# Patient Record
Sex: Female | Born: 1980 | Race: White | Hispanic: No | Marital: Married | Smoking: Never smoker
Health system: Southern US, Community
[De-identification: ages and names within clinical notes are randomized; demographics above are authoritative.]

## PROBLEM LIST (undated history)

## (undated) DIAGNOSIS — A6000 Herpesviral infection of urogenital system, unspecified: Secondary | ICD-10-CM

## (undated) DIAGNOSIS — Z9889 Other specified postprocedural states: Secondary | ICD-10-CM

## (undated) DIAGNOSIS — R112 Nausea with vomiting, unspecified: Secondary | ICD-10-CM

## (undated) DIAGNOSIS — G44229 Chronic tension-type headache, not intractable: Secondary | ICD-10-CM

## (undated) HISTORY — DX: Herpesviral infection of urogenital system, unspecified: A60.00

## (undated) HISTORY — PX: BREAST ENHANCEMENT SURGERY: SHX7

## (undated) HISTORY — PX: ABDOMINOPLASTY: SUR9

## (undated) HISTORY — DX: Chronic tension-type headache, not intractable: G44.229

---
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
## 2001-05-12 ENCOUNTER — Inpatient Hospital Stay (HOSPITAL_COMMUNITY): Admission: AD | Admit: 2001-05-12 | Discharge: 2001-05-12 | Payer: Self-pay | Admitting: *Deleted

## 2001-05-26 ENCOUNTER — Other Ambulatory Visit: Admission: RE | Admit: 2001-05-26 | Discharge: 2001-05-26 | Payer: Self-pay | Admitting: *Deleted

## 2001-06-10 ENCOUNTER — Inpatient Hospital Stay (HOSPITAL_COMMUNITY): Admission: AD | Admit: 2001-06-10 | Discharge: 2001-06-10 | Payer: Self-pay | Admitting: *Deleted

## 2001-06-16 ENCOUNTER — Inpatient Hospital Stay (HOSPITAL_COMMUNITY): Admission: AD | Admit: 2001-06-16 | Discharge: 2001-06-16 | Payer: Self-pay | Admitting: *Deleted

## 2001-11-05 ENCOUNTER — Inpatient Hospital Stay (HOSPITAL_COMMUNITY): Admission: AD | Admit: 2001-11-05 | Discharge: 2001-11-05 | Payer: Self-pay | Admitting: *Deleted

## 2001-11-27 ENCOUNTER — Inpatient Hospital Stay (HOSPITAL_COMMUNITY): Admission: AD | Admit: 2001-11-27 | Discharge: 2001-11-27 | Payer: Self-pay | Admitting: *Deleted

## 2001-11-30 ENCOUNTER — Inpatient Hospital Stay (HOSPITAL_COMMUNITY): Admission: AD | Admit: 2001-11-30 | Discharge: 2001-12-02 | Payer: Self-pay | Admitting: *Deleted

## 2002-06-03 ENCOUNTER — Encounter: Admission: RE | Admit: 2002-06-03 | Discharge: 2002-06-03 | Payer: Self-pay | Admitting: Family Medicine

## 2002-07-06 ENCOUNTER — Encounter: Admission: RE | Admit: 2002-07-06 | Discharge: 2002-07-06 | Payer: Self-pay | Admitting: Pediatrics

## 2002-09-16 ENCOUNTER — Other Ambulatory Visit: Admission: RE | Admit: 2002-09-16 | Discharge: 2002-09-16 | Payer: Self-pay | Admitting: Family Medicine

## 2002-09-16 ENCOUNTER — Encounter: Admission: RE | Admit: 2002-09-16 | Discharge: 2002-09-16 | Payer: Self-pay | Admitting: Family Medicine

## 2002-09-17 ENCOUNTER — Encounter: Admission: RE | Admit: 2002-09-17 | Discharge: 2002-09-17 | Payer: Self-pay | Admitting: Sports Medicine

## 2002-09-17 ENCOUNTER — Encounter: Payer: Self-pay | Admitting: Sports Medicine

## 2002-10-16 ENCOUNTER — Encounter: Admission: RE | Admit: 2002-10-16 | Discharge: 2002-10-16 | Payer: Self-pay | Admitting: Family Medicine

## 2002-12-03 ENCOUNTER — Emergency Department (HOSPITAL_COMMUNITY): Admission: EM | Admit: 2002-12-03 | Discharge: 2002-12-03 | Payer: Self-pay | Admitting: Emergency Medicine

## 2003-06-18 ENCOUNTER — Encounter: Admission: RE | Admit: 2003-06-18 | Discharge: 2003-06-18 | Payer: Self-pay | Admitting: Sports Medicine

## 2003-06-23 ENCOUNTER — Encounter: Admission: RE | Admit: 2003-06-23 | Discharge: 2003-06-23 | Payer: Self-pay | Admitting: Sports Medicine

## 2003-11-18 ENCOUNTER — Emergency Department (HOSPITAL_COMMUNITY): Admission: EM | Admit: 2003-11-18 | Discharge: 2003-11-18 | Payer: Self-pay | Admitting: Emergency Medicine

## 2004-07-13 ENCOUNTER — Emergency Department (HOSPITAL_COMMUNITY): Admission: EM | Admit: 2004-07-13 | Discharge: 2004-07-13 | Payer: Self-pay | Admitting: Emergency Medicine

## 2004-07-17 ENCOUNTER — Encounter (INDEPENDENT_AMBULATORY_CARE_PROVIDER_SITE_OTHER): Payer: Self-pay | Admitting: *Deleted

## 2004-07-17 ENCOUNTER — Ambulatory Visit: Payer: Self-pay | Admitting: Family Medicine

## 2004-07-17 ENCOUNTER — Other Ambulatory Visit: Admission: RE | Admit: 2004-07-17 | Discharge: 2004-07-17 | Payer: Self-pay | Admitting: Family Medicine

## 2004-08-15 ENCOUNTER — Ambulatory Visit: Payer: Self-pay | Admitting: Family Medicine

## 2004-09-08 ENCOUNTER — Ambulatory Visit: Payer: Self-pay | Admitting: Family Medicine

## 2004-10-12 ENCOUNTER — Other Ambulatory Visit: Admission: RE | Admit: 2004-10-12 | Discharge: 2004-10-12 | Payer: Self-pay | Admitting: Family Medicine

## 2004-10-12 ENCOUNTER — Encounter (INDEPENDENT_AMBULATORY_CARE_PROVIDER_SITE_OTHER): Payer: Self-pay | Admitting: Specialist

## 2004-10-12 ENCOUNTER — Ambulatory Visit: Payer: Self-pay | Admitting: Family Medicine

## 2004-10-26 ENCOUNTER — Ambulatory Visit: Payer: Self-pay | Admitting: Family Medicine

## 2005-04-23 ENCOUNTER — Ambulatory Visit: Payer: Self-pay | Admitting: Family Medicine

## 2005-04-26 ENCOUNTER — Ambulatory Visit: Payer: Self-pay | Admitting: Obstetrics and Gynecology

## 2005-04-26 ENCOUNTER — Encounter: Payer: Self-pay | Admitting: Obstetrics and Gynecology

## 2005-08-15 ENCOUNTER — Encounter (INDEPENDENT_AMBULATORY_CARE_PROVIDER_SITE_OTHER): Payer: Self-pay | Admitting: *Deleted

## 2005-08-15 LAB — CONVERTED CEMR LAB

## 2005-10-08 ENCOUNTER — Ambulatory Visit: Payer: Self-pay | Admitting: Sports Medicine

## 2005-10-17 ENCOUNTER — Encounter: Payer: Self-pay | Admitting: Obstetrics and Gynecology

## 2005-10-17 ENCOUNTER — Ambulatory Visit: Payer: Self-pay | Admitting: Obstetrics & Gynecology

## 2006-01-24 ENCOUNTER — Ambulatory Visit: Payer: Self-pay | Admitting: Sports Medicine

## 2006-01-29 ENCOUNTER — Encounter: Admission: RE | Admit: 2006-01-29 | Discharge: 2006-01-29 | Payer: Self-pay | Admitting: Sports Medicine

## 2006-03-14 DIAGNOSIS — F329 Major depressive disorder, single episode, unspecified: Secondary | ICD-10-CM | POA: Insufficient documentation

## 2006-03-14 DIAGNOSIS — N879 Dysplasia of cervix uteri, unspecified: Secondary | ICD-10-CM | POA: Insufficient documentation

## 2006-03-14 DIAGNOSIS — F3289 Other specified depressive episodes: Secondary | ICD-10-CM

## 2006-03-14 HISTORY — DX: Major depressive disorder, single episode, unspecified: F32.9

## 2006-03-14 HISTORY — DX: Other specified depressive episodes: F32.89

## 2006-03-14 HISTORY — DX: Dysplasia of cervix uteri, unspecified: N87.9

## 2006-03-15 ENCOUNTER — Encounter (INDEPENDENT_AMBULATORY_CARE_PROVIDER_SITE_OTHER): Payer: Self-pay | Admitting: *Deleted

## 2006-06-13 ENCOUNTER — Telehealth: Payer: Self-pay | Admitting: *Deleted

## 2006-06-14 ENCOUNTER — Ambulatory Visit: Payer: Self-pay | Admitting: Family Medicine

## 2006-06-14 ENCOUNTER — Telehealth (INDEPENDENT_AMBULATORY_CARE_PROVIDER_SITE_OTHER): Payer: Self-pay | Admitting: *Deleted

## 2006-12-10 ENCOUNTER — Telehealth: Payer: Self-pay | Admitting: *Deleted

## 2006-12-31 ENCOUNTER — Encounter: Payer: Self-pay | Admitting: Family Medicine

## 2007-02-05 ENCOUNTER — Ambulatory Visit: Payer: Self-pay | Admitting: Obstetrics and Gynecology

## 2007-07-01 ENCOUNTER — Telehealth: Payer: Self-pay | Admitting: Family Medicine

## 2007-08-06 ENCOUNTER — Encounter: Payer: Self-pay | Admitting: Physician Assistant

## 2007-08-06 ENCOUNTER — Ambulatory Visit: Payer: Self-pay | Admitting: Obstetrics and Gynecology

## 2008-06-07 ENCOUNTER — Ambulatory Visit: Payer: Self-pay | Admitting: Family Medicine

## 2008-06-07 ENCOUNTER — Other Ambulatory Visit: Admission: RE | Admit: 2008-06-07 | Discharge: 2008-06-07 | Payer: Self-pay | Admitting: Family Medicine

## 2008-06-07 ENCOUNTER — Encounter: Payer: Self-pay | Admitting: Family Medicine

## 2008-06-07 LAB — CONVERTED CEMR LAB
Bilirubin Urine: NEGATIVE
Blood in Urine, dipstick: NEGATIVE
Glucose, Urine, Semiquant: NEGATIVE
Ketones, urine, test strip: NEGATIVE
Nitrite: NEGATIVE
Protein, U semiquant: NEGATIVE
Specific Gravity, Urine: 1.025
Urobilinogen, UA: 0.2
Whiff Test: POSITIVE
pH: 6

## 2008-06-10 LAB — CONVERTED CEMR LAB
Chlamydia, DNA Probe: NEGATIVE
GC Probe Amp, Genital: NEGATIVE

## 2008-07-14 ENCOUNTER — Ambulatory Visit: Payer: Self-pay | Admitting: Family Medicine

## 2008-07-15 ENCOUNTER — Telehealth: Payer: Self-pay | Admitting: Family Medicine

## 2008-07-16 ENCOUNTER — Telehealth: Payer: Self-pay | Admitting: Family Medicine

## 2008-07-21 ENCOUNTER — Encounter: Payer: Self-pay | Admitting: Family Medicine

## 2008-11-11 ENCOUNTER — Encounter: Payer: Self-pay | Admitting: Family Medicine

## 2008-11-11 ENCOUNTER — Ambulatory Visit: Payer: Self-pay | Admitting: Family Medicine

## 2008-11-11 LAB — CONVERTED CEMR LAB: Rapid Strep: NEGATIVE

## 2008-11-16 ENCOUNTER — Telehealth: Payer: Self-pay | Admitting: Family Medicine

## 2008-11-16 ENCOUNTER — Ambulatory Visit: Payer: Self-pay | Admitting: Family Medicine

## 2008-11-16 LAB — CONVERTED CEMR LAB: Heterophile Ab Screen: NEGATIVE

## 2008-11-23 ENCOUNTER — Telehealth: Payer: Self-pay | Admitting: Family Medicine

## 2009-01-25 ENCOUNTER — Telehealth: Payer: Self-pay | Admitting: Family Medicine

## 2009-01-25 ENCOUNTER — Ambulatory Visit: Payer: Self-pay | Admitting: Family Medicine

## 2009-01-25 ENCOUNTER — Encounter: Payer: Self-pay | Admitting: Family Medicine

## 2009-01-25 LAB — CONVERTED CEMR LAB: Beta hcg, urine, semiquantitative: POSITIVE

## 2009-01-26 LAB — CONVERTED CEMR LAB
Antibody Screen: NEGATIVE
Basophils Absolute: 0.1 10*3/uL (ref 0.0–0.1)
Basophils Relative: 1 % (ref 0–1)
Eosinophils Absolute: 0.3 10*3/uL (ref 0.0–0.7)
Eosinophils Relative: 3 % (ref 0–5)
HCT: 36.8 % (ref 36.0–46.0)
Hemoglobin: 12.9 g/dL (ref 12.0–15.0)
Hepatitis B Surface Ag: NEGATIVE
Lymphocytes Relative: 25 % (ref 12–46)
Lymphs Abs: 2.3 10*3/uL (ref 0.7–4.0)
MCHC: 35.1 g/dL (ref 30.0–36.0)
MCV: 82.5 fL (ref 78.0–100.0)
Monocytes Absolute: 0.4 10*3/uL (ref 0.1–1.0)
Monocytes Relative: 5 % (ref 3–12)
Neutro Abs: 6 10*3/uL (ref 1.7–7.7)
Neutrophils Relative %: 66 % (ref 43–77)
Platelets: 284 10*3/uL (ref 150–400)
RBC: 4.46 M/uL (ref 3.87–5.11)
RDW: 13.8 % (ref 11.5–15.5)
Rh Type: POSITIVE
Rubella: 234.7 intl units/mL — ABNORMAL HIGH
Sickle Cell Screen: NEGATIVE
WBC: 9.1 10*3/uL (ref 4.0–10.5)

## 2009-01-27 ENCOUNTER — Telehealth: Payer: Self-pay | Admitting: Family Medicine

## 2009-02-10 ENCOUNTER — Ambulatory Visit: Payer: Self-pay | Admitting: Family Medicine

## 2009-02-10 LAB — CONVERTED CEMR LAB

## 2009-02-11 ENCOUNTER — Telehealth: Payer: Self-pay | Admitting: *Deleted

## 2009-02-17 ENCOUNTER — Ambulatory Visit (HOSPITAL_COMMUNITY): Admission: RE | Admit: 2009-02-17 | Discharge: 2009-02-17 | Payer: Self-pay | Admitting: Family Medicine

## 2009-02-17 ENCOUNTER — Encounter: Payer: Self-pay | Admitting: Family Medicine

## 2009-02-24 ENCOUNTER — Telehealth: Payer: Self-pay | Admitting: Family Medicine

## 2009-03-09 ENCOUNTER — Encounter: Payer: Self-pay | Admitting: Family Medicine

## 2009-03-17 ENCOUNTER — Ambulatory Visit (HOSPITAL_COMMUNITY): Admission: RE | Admit: 2009-03-17 | Discharge: 2009-03-17 | Payer: Self-pay | Admitting: Sports Medicine

## 2009-04-07 ENCOUNTER — Ambulatory Visit (HOSPITAL_COMMUNITY): Admission: RE | Admit: 2009-04-07 | Discharge: 2009-04-07 | Payer: Self-pay | Admitting: Obstetrics and Gynecology

## 2009-09-20 ENCOUNTER — Encounter (INDEPENDENT_AMBULATORY_CARE_PROVIDER_SITE_OTHER): Payer: Self-pay | Admitting: Obstetrics and Gynecology

## 2009-09-20 ENCOUNTER — Inpatient Hospital Stay (HOSPITAL_COMMUNITY): Admission: RE | Admit: 2009-09-20 | Discharge: 2009-09-22 | Payer: Self-pay | Admitting: Obstetrics and Gynecology

## 2010-02-16 NOTE — Assessment & Plan Note (Signed)
Summary: 9.5 weeks   Vital Signs:  Patient profile:   30 year old female LMP:     12/04/2008 Height:      62 inches Weight:      116 pounds BP sitting:   122 / 76  Vitals Entered By: Jone Baseman CMA (February 10, 2009 3:33 PM) CC: NOB LMP (date): 12/04/2008 EDC by LMP==> 09/10/2009 EDC 09/10/2009     Enter LMP: 12/04/2008 Last PAP Result NEGATIVE FOR INTRAEPITHELIAL LESIONS OR MALIGNANCY.   Primary Care Provider:  Marisue Ivan  MD  CC:  NOB.  History of Present Illness: Pt is here for new OB visit, referred from her fantastic PCP of Dr. Burnadette Pop.  Pt is doing wewll, a little more gas then usual but otherwise feeling fine.  Pt states she is taking a MVI, her and her baby's father have been reading books and are very excited about the pregnacy.  Pt prevously had a vaginal term delivery > 7 yrs ago without complications.  Pt has had a LEEP procedure done after that pregnancy.  Last Pap smear in may was normal.     Pt is a G3P1011 @ 9.5 weeks. O+/ab neg, RI, Hep B neg, RPR NR,  HIV NR, SS neg, Hgb 12.9 Plt 284    Pt has breast implants but is wanting to attempt breast feeding.    Habits & Providers  Alcohol-Tobacco-Diet     Cigarette Packs/Day: n/a  Allergies: No Known Drug Allergies  Past History:  Past medical, surgical, family and social histories (including risk factors) reviewed, and no changes noted (except as noted below).  Past Medical History: Reviewed history from 01/25/2009 and no changes required. 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 None  Family History: Reviewed history from 01/25/2009 and no changes required. F-29yo, healthy,  M-29yo, asthma; depression GF died melanoma,   Social History: Reviewed history from 01/25/2009 and no changes required. Married. 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 Lungs are clear to auscultation, no crackles or wheezes. Heart:  Normal rate and regular rhythm. S1 and S2 normal without gallop, murmur, click, rub or other extra sounds. Abdomen:  Bowel sounds positive,abdomen soft and non-tender without masses, organomegaly or hernias noted.  Uterus palpable in pelvis Genitalia:  Pt has shaved genital, peircing    Impression & Recommendations:  Problem # 1:  PREGNANCY (ICD-V22.2) Assessment Unchanged Pt told what to expect.  Will have ultara sound and integrated screen  NEXT VISIT NEED GC/CHLAMYDIA PAP, and maybe AFP/ Quad Screen check on that.   O+, ab neg, RPR, HIV, Hep B NR, RI, Hgb 12.9 Plt 284  Orders: Prenatal U/S < 14 weeks - 29562  (Prenatal U/S) Obstetric Referral (Obstetric) Other OB visit- [facility] ([facility])  Complete Medication List: 1)  Gnp Prenatal Vitamins 28-0.8 Mg Tabs (Prenatal vit-fe fumarate-fa) .... One tablet by mouth daily  Prenatal Visit EDC Confirmation:    New working [facility] 09/10/2009    Last menses onset (LMP) date: 12/04/2008    EDC by LMP:  09/10/2009   Flowsheet View for Follow-up Visit    Estimated weeks of       gestation:     9 5/7    Weight:     116    Blood pressure:   122 / 76    Headache:     No    Nausea/vomiting:   No    Edema:     0    Vaginal bleeding:   no    Vaginal discharge:   no    Fundal height:      9    Labor symptoms:   no    Cx Dilation:     0    Cx Effacement:   0%    Cx Station:     high    Taking prenatal vits?   Y    Smoking:     n/a    Next visit:     4 wk    Resident:      Katrinka Blazing    Preceptor:     Chambliss   Past Pregnancy History    Gravida:     3    Term Births:     1    Premature Births:   0    Living Children:   0    Para:       1    Mult. Births:     0    Prev C-Section:   0    Prev. VBAC attempt?   none    Aborta:     1    Elect. Ab:     0    Spont. 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    Number of children at home: 1  Menstrual History    LMP (date): 12/04/2008    EDC by LMP: 09/10/2009    Best Working EDC: 09/10/2009   Genetic History     Thalassemia:     mother: no    Neural tube defect:   mother: no    Down's Syndrome:   mother: no    Tay-Sachs:     mother: no    Sickle Cell Dz/Trait:   mother: no    Hemophilia:     mother: no    Muscular Dystrophy:   mother: no    Cystic Fibrosis:   mother: no    Huntington's Dz:   mother: no    Mental Retardation:   mother: no    Fragile X:     mother: no    Other Genetic or       Chromosomal Dz:   mother: no    Child with other       birth defect:     mother: no    > 3 spont. abortions:   mother: no    Hx of stillbirth:     mother: no  Infection Risk History    High Risk Hepatitis B: no    Immunized against Hepatitis B: no    Exposure to TB: no    Patient with history of Genital Herpes: no    Sexual partner with history of Genital Herpes: no    History of STD (GC, Chlamydia, Syphilis, HPV): no    Rash, Viral, or Febrile Illness since LMP: no    Exposure  to Cat Litter: no  Environmental Exposures    Xray Exposure since LMP: no    Chemical or other exposure: no    Medication, drug, or alcohol use since LMP: no     Prevention & Chronic Care Immunizations   Influenza vaccine: Not documented    Tetanus booster: 05/16/2002: Done.   Tetanus booster due: 05/15/2012    Pneumococcal vaccine: Not documented  Other Screening   Pap smear: NEGATIVE FOR  INTRAEPITHELIAL LESIONS OR MALIGNANCY.  (06/07/2008)   Pap smear due: 06/07/2009   Smoking status: never  (01/25/2009)  Appended Document: 9.5 weeks Pt with h/o LEEP after her term delivery, so will refer to high risk for consult.  Also due for flu shot.  I

## 2010-02-16 NOTE — Assessment & Plan Note (Signed)
Summary: new pregnancy   Vital Signs:  Patient profile:   30 year old female Height:      64 inches Weight:      116.7 pounds BMI:     20.10 Temp:     97.5 degrees F oral Pulse rate:   85 / minute BP sitting:   116 / 78  (left arm) Cuff size:   regular  Vitals Entered By: Gladstone Pih (January 25, 2009 2:19 PM) CC: missed last period Is Patient Diabetic? No Pain Assessment Patient in pain? no        Primary Care Provider:  Marisue Ivan  MD  CC:  missed last period.  History of Present Illness: 30yo F here b/c she missed her last period  Missed last period: LMP 12/03/2008.  Was previously on Sprintec but has admitted to missing occasional doses and has not had it since the middle of December.  Denies any abd pain, fever, or vaginal bleeding/discharge.  Habits & Providers  Alcohol-Tobacco-Diet     Tobacco Status: never  Current Medications (verified): 1)  Gnp Prenatal Vitamins 28-0.8 Mg Tabs (Prenatal Vit-Fe Fumarate-Fa) .... 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 lives with  son (b.2003 and husband no smoking/ETOH/ellicit drugs;   unemployed;   Review of Systems       Denies any abd pain, fever, or vaginal bleeding/discharge.  Physical Exam  General:  VS reviewed.  Well appearing, NAD. Psych:  Appropriate affect Seems geniunely happy about pregnancy   Impression & Recommendations:  Problem # 1:  PREGNANCY (ICD-V22.2) Assessment New Confirmed with [employer].  [id number] at 7 3/7 wks per LMP (12/03/2008) EDD: 09/09/2009 Obtain prenatal labs today.  Start PNVs.  Discussed integrated screen...will need to be set up via [facility] b/w 11-13 wks.   Last  pap smear: normal 06/07/2008 Will f/u with either myself or Dr. Katrinka Blazing in the next 2 weeks for initial OB visit.  Will need GC/Chl and flu vaccine at that time.  Orders: RPR-[facility] [phone]) HIV-[facility] [phone]) Prenatal-[facility] ([id number]) Urine Culture-[facility] ([id number]) Sickle Cell Scr-[facility] ([id number]) [facility]- Est  Level 4 (09323)  Complete Medication List: 1)  Gnp Prenatal Vitamins 28-0.8 Mg Tabs (Prenatal vit-fe fumarate-fa) .... One tablet by mouth daily  Other Orders: U Preg-[facility] (55732)  Patient Instructions: 1)  I will either see you in within the next 2 weeks for your initial OB visit or I'll have you see Dr. Terrilee Files. 2)  You are 7 wks and 3 days.  Estimated due date 09/09/2009. 3)  Start the prenatal vitamins. Prescriptions: GNP PRENATAL VITAMINS 28-0.8 MG TABS (PRENATAL VIT-FE FUMARATE-FA) one tablet by mouth daily  #30 x 11   Entered and Authorized by:   Marisue Ivan  MD   Signed by:   Marisue Ivan  MD on 01/25/2009   Method used:   Electronically to        [facility] (retail)       [address]       [place], [place]  [address]       Ph: [phone]       Fax: [phone]   RxID:   [id number]  Laboratory Results   Urine Tests  Date/Time Received: January 25, 2009 2:33 PM  Date/Time Reported: January 25, 2009 2:35 PM     Urine HCG: positive Comments: ...............test performed by......Marland KitchenBonnie A. [place], MLS (ASCP)cm       Prevention & Chronic Care Immunizations   Influenza vaccine: Not documented    Tetanus booster: 05/16/2002: Done.   Tetanus booster due: 05/15/2012    Pneumococcal vaccine: Not documented  Other Screening   Pap smear: NEGATIVE FOR INTRAEPITHELIAL LESIONS OR MALIGNANCY.  (06/07/2008)   Pap smear due: 06/07/2009   Smoking status: never  (01/25/2009)

## 2010-02-16 NOTE — Progress Notes (Signed)
Summary: phn msg   Phone Note Call from Patient Call back at [facility] Phone [phone]   Caller: Patient Summary of Call: Pt checking on status of ultrasound that she was to have done.  Wondering if it can be on Thursday. Initial call taken by: Clydell Hakim,  February 11, 2009 2:28 PM

## 2010-02-16 NOTE — Progress Notes (Signed)
Summary: triage   Phone Note Call from Patient Call back at Home Phone [phone]   Caller: Patient Summary of Call: Stopped taking birth control a couple of weeks ago and has not started period is this normal?  Follow-up for Phone Call        forgot to get refill. out of town. usual periods are on 20th. no menses since November. sexually active. will see Dr. Lafonda Mosses tomorrow at 4:15 to get pregnancy test. does not want to come in today Follow-up by: Golden Circle RN,  January 25, 2009 10:37 AM  Additional Follow-up for Phone Call Additional follow up Details #1::        she called back & can come for 2:15 today with pcp Additional Follow-up by: Golden Circle RN,  January 25, 2009 10:53 AM    Additional Follow-up for Phone Call Additional follow up Details #2::    Pt seen and is indeed preganant.   Follow-up by: Marisue Ivan  MD,  January 25, 2009 3:10 PM

## 2010-02-16 NOTE — Progress Notes (Signed)
Summary: triage   Phone Note Call from Patient Call back at Home Phone [phone]   Caller: Patient Summary of Call: needs to talk to nurse about what she can take for headache - 7 weeks preg. Initial call taken by: De Nurse,  January 27, 2009 11:14 AM  Follow-up for Phone Call        HA started today, told to take tylenol for the Ha, and call if any other symptom appear. Follow-up by: Gladstone Pih,  January 27, 2009 11:19 AM  Additional Follow-up for Phone Call Additional follow up Details #1::        agree with plan Additional Follow-up by: Marisue Ivan  MD,  January 27, 2009 11:25 AM

## 2010-02-16 NOTE — Miscellaneous (Signed)
Summary: cancelled appt   pt called to cancel OB appt for tomorrow.  Was asked if she wanted to resch she stated that she will have to call back after she figures out her sched.   De Nurse  March 09, 2009 9:14 AM  rec'd release of info for pt to go to [facility]  March 16, 2009 10:42 AM   Clinical Lists Changes  Appended Document: cancelled appt Pt is going to [facility]

## 2010-02-16 NOTE — Progress Notes (Signed)
Summary: Ultrasound Res   Phone Note Call from Patient Call back at Home Phone [phone]   Caller: Patient Summary of Call: Checking on results of ultrasound.   Initial call taken by: Clydell Hakim,  February 24, 2009 3:31 PM  Follow-up for Phone Call        wants to know if she should go this week to have test done for down syndrom - thinks she is too early for this. (they sched her for this last week at other [place] appt) Follow-up by: Theresia Lo RN,  February 24, 2009 4:33 PM  Additional Follow-up for Phone Call Additional follow up Details #1::        told her to keep this week's appt. Quad screen is usually done at 16 wk per B. [place]. assured her we will make all appts at appropriate times.  WANTS DR Katrinka Blazing TO CALL HER Additional Follow-up by: Golden Circle RN,  February 28, 2009 10:17 AM    Additional Follow-up for Phone Call Additional follow up Details #2::    Called pt back told her to keep appointment will get u/s as scheduled and then we will draw the blood at later date.

## 2010-02-21 ENCOUNTER — Other Ambulatory Visit: Payer: Self-pay | Admitting: Obstetrics and Gynecology

## 2010-02-21 ENCOUNTER — Other Ambulatory Visit (HOSPITAL_COMMUNITY)
Admission: RE | Admit: 2010-02-21 | Discharge: 2010-02-21 | Disposition: A | Discharge status: Home / Self Care | Payer: BC Managed Care – PPO | Source: Ambulatory Visit | Attending: Obstetrics and Gynecology | Admitting: Obstetrics and Gynecology

## 2010-02-21 DIAGNOSIS — Z01419 Encounter for gynecological examination (general) (routine) without abnormal findings: Secondary | ICD-10-CM | POA: Insufficient documentation

## 2010-02-24 ENCOUNTER — Encounter: Payer: Self-pay | Admitting: *Deleted

## 2010-03-30 LAB — CBC
HCT: 35 % — ABNORMAL LOW (ref 36.0–46.0)
HCT: 39.8 % (ref 36.0–46.0)
Hemoglobin: 12 g/dL (ref 12.0–15.0)
Hemoglobin: 13.5 g/dL (ref 12.0–15.0)
MCH: 29.5 pg (ref 26.0–34.0)
MCH: 30 pg (ref 26.0–34.0)
MCHC: 33.8 g/dL (ref 30.0–36.0)
MCHC: 34.2 g/dL (ref 30.0–36.0)
MCV: 87.2 fL (ref 78.0–100.0)
MCV: 87.6 fL (ref 78.0–100.0)
Platelets: 133 10*3/uL — ABNORMAL LOW (ref 150–400)
Platelets: 155 10*3/uL (ref 150–400)
RBC: 3.99 MIL/uL (ref 3.87–5.11)
RBC: 4.57 MIL/uL (ref 3.87–5.11)
RDW: 16 % — ABNORMAL HIGH (ref 11.5–15.5)
RDW: 16.1 % — ABNORMAL HIGH (ref 11.5–15.5)
WBC: 13.6 10*3/uL — ABNORMAL HIGH (ref 4.0–10.5)
WBC: 14.1 10*3/uL — ABNORMAL HIGH (ref 4.0–10.5)

## 2010-03-30 LAB — RPR: RPR Ser Ql: NONREACTIVE

## 2010-05-30 NOTE — Group Therapy Note (Signed)
NAME:  Danielle Saunders, Danielle Saunders NO.:  [id number]   MEDICAL RECORD NO.:  [id number]          PATIENT TYPE:  WOC   LOCATION:  [facility]                   FACILITY:  [facility]   PHYSICIAN:  Maylon Cos, CNM    DATE OF BIRTH:  [birth year]   DATE OF SERVICE:  08/06/2007                                  CLINIC NOTE   The patient is being seen today on August 06, 2007 in the GYN clinic at  [facility].  She presents today for an annual exam,  Pap smear and  IUD insertion.  Currently she is not taking any medications.  She has no  known drug allergies.  Her immunizations are up to date.   MENSTRUAL HISTORY:  The patient had a Mirena IUD x5 years that was  removed in January of 2009.  Since then she has been using the NuvaRing  for contraception while she has been awaiting insurance coverage.  She  has been doing continuous cycling and reports that the first day of her  last menstrual period was approximately 5 days ago and ended yesterday  on July 21.  It was a normal period.   OBSTETRICAL HISTORY:  She has been pregnant twice.  She has had one  miscarriage and has one living child that she had vaginally without  complications.  Her last Pap smear was in October of 2007, and it was  negative for intraepithelial lesions.  She has had one abnormal Pap  smear that required a colposcopy in September 2006.  However, all have  been normal since then.  The patient had a mammogram also in September  2066 and it was normal.   SURGICAL HISTORY:  Positive for bilateral breast augmentation that was  performed by Dr. Stephens November.   FAMILY HISTORY:  Positive for diabetes, heart attack, high blood  pressure and cancer in her grandfather.   PERSONAL MEDICAL HISTORY:  Negative.   SOCIAL HISTORY:  She works part time and lives with her son.  She does  not smoke.  She does not drink alcohol.  She drinks caffeinated  beverages approximately 2 to 3 times daily.   REVIEW OF SYSTEMS:  Negative  x12.   EXAMINATION:  Today her vital signs are stable.  She is afebrile.  Her  blood pressure is 121/86.  Her  weight is 123.2 and she is 62 inches  tall.  GENERALLY:  She is a pleasant Caucasian female in no apparent distress  who appears to be her stated age of 30.  HEENT :  Exam is grossly normal.  Good dentition.  She has no  thyromegaly.  No lymphadenopathy.  BREAST EXAM:  Benign.  She has no masses.  Her breasts are nontender.  She does have bilateral scarring on the posterior aspect of her areola  where her breast implant was placed.  Breast implants are palpable on  both sides.  There is no dimpling or wrinkling of the skin.  The  implants appear to be intact.  The  nipples are erect without discharge.  ABDOMEN:  Exam is benign.  She has irregular stria and a  moderate  amount of  loose skin.  However, there is no tenderness, no masses.  Her femoral pulses are equal bilaterally.  GENITALIA:  The patient has a shaved perineum.  She has a pierced  clitoris ring.  She has 3 varicosities which are located on her left  labia that are nontender and causing her no problems at the present.  She has scant vaginal discharge,  moist mucous membranes with irregular  rugae and good vaginal time. Her cervix is easily visualized using a  Peterson speculum.  It is smooth without lesions.  She has no cervical  motion tenderness.  Her bimanual exam is benign.  No uterine enlargement  and no tenderness.  Adnexal are not enlarged and nontender bilaterally.  Pap smear was obtained during the pelvic exam and an IUD was placed.  The cervix was prepped using Betadine swab and cleansed x2.  Uterus  sounded to 6-1/2 cm.  A Mirena IUD was placed without difficulty.  The  tenaculum was used to advance the Mirena IUD.  The patient tolerated the  procedure well.  There was a minimal amount of vaginal bleeding from the  tenaculum placement.  Mirena strings were snipped to approximately a  centimeter and half  below the external cervical os and the speculum was  removed.  The patient's extremities are warm to touch with even hair  distribution, equal pedal pulses and normal reflexes.   ASSESSMENT:  1. Danielle Saunders is 30 year old well woman.  2. Contraception placed on exam.  3. Health maintenance.   PLAN:  1. Pap smear was obtained.  Gonorrhea and Chlamydia cultures were sent      along with STI screening.  Patient encouraged to do monthly self-      breast exams, increase her activity and take a daily multivitamin.  2. Mirena IUD was placed without difficulty.  She should return in 5-6      weeks after her next menstrual period for an IUD check.  It will be      removed August 05, 2012.           ______________________________  Maylon Cos, CNM     SS/MEDQ  D:  08/06/2007  T:  08/06/2007  Job:  [id number]

## 2010-05-30 NOTE — Discharge Summary (Signed)
NAME:  Danielle Saunders, LAUTER NO.:  [id number]   MEDICAL RECORD NO.:  [id number]          PATIENT TYPE:  WOC   LOCATION:  [facility]                          FACILITY:  [facility]   PHYSICIAN:  Maylon Cos, C.N.M. DATE OF BIRTH:  03-30-1980   DATE OF ADMISSION:  02/05/2007  DATE OF DISCHARGE:                               DISCHARGE SUMMARY   REASON FOR VISIT:  The patient was seen today, February 05, 2007, for an  IUD removal.   HISTORY OF PRESENT ILLNESS:  The patient is a white 30 year old who  presents for the removal of her Mirena IUD that was placed 5 years ago.  Her visit today is problems. Marland Kitchen   VITAL SIGNS:  Temperature was 98.1, pulse 90, blood pressure 115/77, her  weight is 124, her height is 5 feet 2 inches, respirations 24.   ALLERGIES:  SHE IS NOT ALLERGIC TO ANYTHING.   GYNECOLOGICAL HISTORY:  She has not had a period in the 5 years that her  IUD has been in place and her Pap smear was approximately 1-1/2 years  ago and it was normal.   PHYSICAL EXAMINATION:  PELVIC:  On exam her cervix was easily visualized  using Peterson speculum and the strings of her IUD were visualized  approximately 1 cm below the external cervical os.  The strings were  grasped using a ring forceps and removed in 1 motion.  The IUD was  intact at the time of removal.   ASSESSMENT/PLAN:  A 30 year old female, intrauterine device removal for  contraceptive management.  At this time the patient is without health  insurance and was referred to the [employer] program for the family  planning waver.  She was given 2 samples of the NuvaRing to use in the  meantime for the next 2 months, while she is qualifying for assistance.  She is also not desiring any future fertility at this time and would  like to have an intrauterine device placed as soon as possible.  Literature and questions were answered concerning the NuvaRing which was  placed after the intrauterine device was removed and the patient  is to  remove in 30 days.  She is also going to be doing continuous use of  NuvaRing as well.  Patient should return as soon as possible once her  Medicaid has been granted or she has the funds available for another  intrauterine device placement.   HEALTH MAINTENANCE:  The patient is due for an annual examination and  Pap smear and will plan to schedule that at the time of her intrauterine  device insertion when she gets finances or insurance coverage available.      Maylon Cos, C.N.M.     SS/MEDQ  D:  02/05/2007  T:  02/05/2007  Job:  [id number]

## 2010-06-02 NOTE — Group Therapy Note (Signed)
NAME:  SHENEA, Danielle Saunders NO.:  [id number]   MEDICAL RECORD NO.:  [id number]          PATIENT TYPE:  WOC   [facility]ATION[facility]                   FACILITY:  [facility]   PHYSICIAN:  Tinnie Gens, MD        DATE OF BIRTH:  09-08-1980   DATE OF SERVICE:  09/08/2004                                    CLINIC NOTE   CHIEF COMPLAINT:  LEEP evaluation.   HISTORY OF PRESENT ILLNESS:  The patient is a 30 year old gravida 2 para 1-0-  1-1 who was referred from the [facility]. The patient had a Pap  that showed low-grade SIL. Colposcopic biopsy showed CIN-2 with extension  into the endocervical glands. The patient has a maternal cousin who just  died of cervical cancer at age 65. The patient has a Mirena IUD in place and  has not had a cycle in 2 years.   PAST MEDICAL HISTORY:  Negative.   PAST SURGICAL HISTORY:  Negative.   MEDICATIONS:  Doxycycline 100 mg daily. No known allergies.   OBSTETRICAL HISTORY:  G2, P1, one SVD.   GYNECOLOGICAL HISTORY:  Last Pap July 2006. Pap history in the HPI. She is  on an IUD. No cycle for the last 2 years.   FAMILY HISTORY:  Diabetes, coronary artery disease, hypertension, and skin  and cervical cancer.   SOCIAL HISTORY:  The patient lives with her son. She does not smoke or drink  alcohol.   REVIEW OF SYSTEMS:  A 14-point review of systems reviewed and is positive  for bruising and frequent headaches which she follows up with family  practice for.   PHYSICAL EXAMINATION TODAY:  VITAL SIGNS:  As noted in the chart.  GENERAL:  She is a thin white female in no acute distress.  GENITOURINARY:  She has normal external female genitalia. The vagina is pink  and rugated. The cervix is visualized and is without lesion. It is parous.   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   PLAN:  For LEEP. She will watch the video today and reschedule back for the  first available  appointment.           ______________________________  Tinnie Gens, MD     TP/MEDQ  D:  09/08/2004  T:  09/08/2004  Job:  [id number]

## 2010-06-02 NOTE — Group Therapy Note (Signed)
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 The patient  was informed of the procedure and wished to proceed. The speculum was  placed, acetic acid was put on the cervix to help with visualization of the  lesion. The patient had an acetowhite lesion identified by colposcopy at 1,  5, and 11 o'clock. Lidocaine was inserted in a paracervical block at 3 mL at  2 o'clock, 4 o'clock, 7 o'clock, and 10 o'clock. The patient had good  anesthesia. The Fisher loop was inserted and the instrument was rotated 360  degrees. The specimen was sent to pathology. Coagulation was done around the  perimeter of the region to obtain excellent hemostasis. The patient did have  an IUD which the strings were visualized. After the procedure, the patient  did have Monsel's placed for additional hemostasis. The speculum was  removed. The patient tolerated the procedure well.   IMPRESSION AND PLAN:  The patient is to follow up with her primary care  physician in 3-4 months. She was told to have nothing vaginally for 2 weeks  and was told to expect a dark brown discharge and possible bleeding. The  patient was to have a Pap smear in 3-4 months.     ______________________________  Kathlyn Sacramento, M.D.    ______________________________  Tinnie Gens, MD    AC/MEDQ  D:  10/12/2004  T:  10/13/2004  Job:  [id number]   cc:   Ursula Beath, MD

## 2010-10-13 LAB — POCT PREGNANCY, URINE
Operator id: 148111
Preg Test, Ur: NEGATIVE

## 2011-02-27 ENCOUNTER — Other Ambulatory Visit (HOSPITAL_COMMUNITY)
Admission: RE | Admit: 2011-02-27 | Discharge: 2011-02-27 | Disposition: A | Discharge status: Home / Self Care | Payer: BC Managed Care – PPO | Source: Ambulatory Visit | Attending: Obstetrics and Gynecology | Admitting: Obstetrics and Gynecology

## 2011-02-27 ENCOUNTER — Other Ambulatory Visit: Payer: Self-pay | Admitting: Obstetrics and Gynecology

## 2011-02-27 DIAGNOSIS — Z01419 Encounter for gynecological examination (general) (routine) without abnormal findings: Secondary | ICD-10-CM | POA: Insufficient documentation

## 2012-02-27 ENCOUNTER — Other Ambulatory Visit: Payer: Self-pay | Admitting: Obstetrics and Gynecology

## 2012-02-27 ENCOUNTER — Other Ambulatory Visit (HOSPITAL_COMMUNITY)
Admission: RE | Admit: 2012-02-27 | Discharge: 2012-02-27 | Disposition: A | Discharge status: Home / Self Care | Payer: BC Managed Care – PPO | Source: Ambulatory Visit | Attending: Obstetrics and Gynecology | Admitting: Obstetrics and Gynecology

## 2012-02-27 DIAGNOSIS — Z01419 Encounter for gynecological examination (general) (routine) without abnormal findings: Secondary | ICD-10-CM | POA: Insufficient documentation

## 2012-02-27 DIAGNOSIS — Z1151 Encounter for screening for human papillomavirus (HPV): Secondary | ICD-10-CM | POA: Insufficient documentation

## 2013-04-03 ENCOUNTER — Other Ambulatory Visit: Payer: Self-pay | Admitting: Obstetrics and Gynecology

## 2013-04-03 ENCOUNTER — Other Ambulatory Visit (HOSPITAL_COMMUNITY)
Admission: RE | Admit: 2013-04-03 | Discharge: 2013-04-03 | Disposition: A | Discharge status: Home / Self Care | Payer: BC Managed Care – PPO | Source: Ambulatory Visit | Attending: Obstetrics and Gynecology | Admitting: Obstetrics and Gynecology

## 2013-04-03 DIAGNOSIS — Z01419 Encounter for gynecological examination (general) (routine) without abnormal findings: Secondary | ICD-10-CM | POA: Insufficient documentation

## 2014-04-02 ENCOUNTER — Other Ambulatory Visit (HOSPITAL_COMMUNITY)
Admission: RE | Admit: 2014-04-02 | Discharge: 2014-04-02 | Disposition: A | Discharge status: Home / Self Care | Payer: Self-pay | Source: Ambulatory Visit | Attending: Obstetrics and Gynecology | Admitting: Obstetrics and Gynecology

## 2014-04-02 ENCOUNTER — Other Ambulatory Visit: Payer: Self-pay | Admitting: Obstetrics and Gynecology

## 2014-04-02 DIAGNOSIS — Z01419 Encounter for gynecological examination (general) (routine) without abnormal findings: Secondary | ICD-10-CM | POA: Insufficient documentation

## 2014-04-05 LAB — CYTOLOGY - PAP

## 2014-10-27 ENCOUNTER — Encounter: Payer: Self-pay | Admitting: Primary Care

## 2014-10-27 ENCOUNTER — Ambulatory Visit (INDEPENDENT_AMBULATORY_CARE_PROVIDER_SITE_OTHER): Payer: BLUE CROSS/BLUE SHIELD | Admitting: Primary Care

## 2014-10-27 VITALS — BP 118/72 | HR 76 | Temp 97.5°F | Ht 61.0 in | Wt 140.0 lb

## 2014-10-27 DIAGNOSIS — G44229 Chronic tension-type headache, not intractable: Secondary | ICD-10-CM | POA: Diagnosis not present

## 2014-10-27 HISTORY — DX: Chronic tension-type headache, not intractable: G44.229

## 2014-10-27 MED ORDER — TOPIRAMATE 25 MG PO TABS: 25.0000 mg | ORAL_TABLET | Freq: Two times a day (BID) | ORAL | Status: DC

## 2014-10-27 NOTE — Assessment & Plan Note (Signed)
Present for years, worse over past several months. + nausea and photophobia with headaches. Some relief with ibuprofen and tylenol. Will start low dose Topamax daily at bedtime. Follow up in 6 weeks.

## 2014-10-27 NOTE — Progress Notes (Signed)
Pre visit review using our clinic review tool, if applicable. No additional management support is needed unless otherwise documented below in the visit note. 

## 2014-10-27 NOTE — Progress Notes (Signed)
   Subjective:    Patient ID: Danielle Saunders, female    DOB: 18-Feb-1980, 34 y.o.   MRN: [id number]  HPI  Danielle Saunders is a 34 year old female who presents today to establish care and discuss the problems mentioned below. Will obtain old records.  1) Frequent Headaches: Present for the past several years. Over the past several months her headaches have become worse. Her headaches are present once weekly on average. She currently has a headache that has been present since Sunday. She drinks 12 ounces of coffee daily. Her headaches are typically located circumferentially around her head which feels like a throbbing sensation. She will experience nausea and photophobia. She will take tylenol, ibuprofen, and excedrin migraine with some relief.   2) Genital Herpes: Diagnosed 1 year ago and is managed on Valtrex 500 mg once daily. No recent outbreaks.  Review of Systems  Constitutional: Negative for unexpected weight change.  HENT: Negative for rhinorrhea.   Eyes: Positive for photophobia.  Respiratory: Negative for cough and shortness of breath.   Cardiovascular: Negative for chest pain.  Gastrointestinal: Negative for diarrhea and constipation.  Genitourinary: Negative for difficulty urinating.       No periods since IUD  Musculoskeletal: Negative for myalgias and arthralgias.  Skin: Negative for rash.  Allergic/Immunologic: Positive for environmental allergies.  Neurological: Positive for headaches. Negative for dizziness.  Psychiatric/Behavioral:       Denies concerns for anxiety or depression       Past Medical History  Diagnosis Date  . Chronic tension headaches   . Genital herpes     Social History   Social History  . Marital Status: Married    Spouse Name: N/A  . Number of Children: N/A  . Years of Education: N/A   Occupational History  . Not on file.   Social History Main Topics  . Smoking status: Never Smoker   . Smokeless tobacco: Not on file  . Alcohol Use: No  .  Drug Use: Not on file  . Sexual Activity: Not on file   Other Topics Concern  . Not on file   Social History Narrative   Married.   2 children.   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    Enjoys relaxing and spending time with her family.     Past Surgical History  Procedure Laterality Date  . Breast enhancement surgery      Family History  Problem Relation Age of Onset  . Hyperlipidemia Father     No Known Allergies  No current outpatient prescriptions on file prior to visit.   No current facility-administered medications on file prior to visit.    BP 118/72 mmHg  Pulse 76  Temp(Src) 97.5 F (36.4 C) (Oral)  Ht 5\' 1"  (1.549 m)  Wt 140 lb (63.504 kg)  BMI 26.47 kg/m2    Objective:   Physical Exam  Constitutional: She appears well-nourished.  Eyes: EOM are normal. Pupils are equal, round, and reactive to light.  Neck: Neck supple.  Cardiovascular: Normal rate and regular rhythm.   Pulmonary/Chest: Effort normal and breath sounds normal.  Neurological: No cranial nerve deficit.  Skin: Skin is warm and dry.  Psychiatric: She has a normal mood and affect.          Assessment & Plan:

## 2014-10-27 NOTE — Patient Instructions (Signed)
Start Topamax for headaches. Take 1 tablet by mouth at bedtime for headache prevention.  Follow up in 6 weeks for re-evaluation of headaches.  It was a pleasure to meet you today! Please don't hesitate to call me with any questions. Welcome to [facility]!

## 2014-10-28 ENCOUNTER — Telehealth: Payer: Self-pay | Admitting: *Deleted

## 2014-10-28 DIAGNOSIS — G44229 Chronic tension-type headache, not intractable: Secondary | ICD-10-CM

## 2014-10-28 MED ORDER — TOPIRAMATE 25 MG PO TABS: 25.0000 mg | ORAL_TABLET | Freq: Every day | ORAL | Status: DC

## 2014-10-28 NOTE — Telephone Encounter (Signed)
Called and notified patient of [name]'s comments. Patient verbalized understanding.  

## 2014-10-28 NOTE — Telephone Encounter (Signed)
Patient was prescribed Topamax 25 mg at ov yesterday 10/12.  The bottle says to take bid but patient recalls being told to take it only at bedtime.  Please clarify instructions.

## 2014-10-28 NOTE — Telephone Encounter (Signed)
Please notify Ms. Gangi to take 1 tablet by mouth at bedtime. I apologize for the confusion. Thanks.

## 2014-10-28 NOTE — Addendum Note (Signed)
Addended by: Jacqualin Combes on: 10/28/2014 03:02 PM   Modules accepted: Orders, Medications

## 2014-10-29 ENCOUNTER — Ambulatory Visit: Payer: Self-pay | Admitting: Primary Care

## 2014-11-05 ENCOUNTER — Telehealth: Payer: Self-pay | Admitting: Primary Care

## 2014-11-05 NOTE — Telephone Encounter (Signed)
Pt called and would like to know if she can either take advil, tylenol or Excedrin with her migraine meds? Today headache is really bad. Please advise.  [phone]

## 2014-11-05 NOTE — Telephone Encounter (Signed)
Yes, okay to take any of those with her headache medication. Please have her notify me if headaches are not improved in one week. Thanks.

## 2014-11-05 NOTE — Telephone Encounter (Signed)
Called and notified patient of [name]'s comments. Patient verbalized understanding.  

## 2014-12-07 ENCOUNTER — Ambulatory Visit (INDEPENDENT_AMBULATORY_CARE_PROVIDER_SITE_OTHER): Payer: BLUE CROSS/BLUE SHIELD | Admitting: Primary Care

## 2014-12-07 ENCOUNTER — Encounter: Payer: Self-pay | Admitting: Primary Care

## 2014-12-07 ENCOUNTER — Encounter (INDEPENDENT_AMBULATORY_CARE_PROVIDER_SITE_OTHER): Payer: Self-pay

## 2014-12-07 VITALS — BP 118/70 | HR 96 | Temp 98.4°F | Ht 61.0 in | Wt 135.8 lb

## 2014-12-07 DIAGNOSIS — G44229 Chronic tension-type headache, not intractable: Secondary | ICD-10-CM

## 2014-12-07 DIAGNOSIS — R110 Nausea: Secondary | ICD-10-CM

## 2014-12-07 MED ORDER — KETOROLAC TROMETHAMINE 30 MG/ML IJ SOLN
30.0000 mg | Freq: Once | INTRAMUSCULAR | Status: AC
  Administered 2014-12-07: 30 mg via INTRAMUSCULAR

## 2014-12-07 MED ORDER — TOPIRAMATE 50 MG PO TABS: 50.0000 mg | ORAL_TABLET | Freq: Every day | ORAL | Status: DC

## 2014-12-07 MED ORDER — ONDANSETRON HCL 4 MG PO TABS: 4.0000 mg | ORAL_TABLET | Freq: Three times a day (TID) | ORAL | Status: DC | PRN

## 2014-12-07 NOTE — Patient Instructions (Signed)
We have increased your Topamax from 25 mg to 50 mg. You may take 2 of the 25 mg tablets until your bottle is empty. Pick up the 50 mg tablets at your pharmacy.  I have sent in a prescription for Zofran. You may take 1 tablet by mouth every 8 hours as needed for nausea.  Please schedule a physical with me in the future at your convenience. You will also schedule a lab only appointment one week prior. We will discuss your lab results during your physical.  Call me if no improvement in headaches with increased dose of Topamax.  It was a pleasure to see you today!

## 2014-12-07 NOTE — Progress Notes (Signed)
   Subjective:    Patient ID: Danielle Saunders, female    DOB: [birth year], 34 y.o.   MRN: [id number]  HPI  [name] is a 34 year old female who presents today for follow up of chronic headaches. Her headaches have been problematic for the last several years and over the last several months have become worse. She was evaluated in mid October for chronic headaches and initiated on Topamax 25 mg HS.   Since her last visit her headaches have improved overall as they are less frequent and of less intensity, but she's continued to have some headaches in general. Today she noticed a headache this morning with nausea and photophobia. Today she's taken 600 mg of ibuprofen and zofran with some improvement.   Review of Systems  Eyes: Positive for photophobia.  Respiratory: Negative for shortness of breath.   Cardiovascular: Negative for chest pain.  Gastrointestinal: Positive for nausea. Negative for vomiting.  Neurological: Positive for headaches. Negative for dizziness.       Past Medical History  Diagnosis Date  . Chronic tension headaches   . Genital herpes     Social History   Social History  . Marital Status: Married    Spouse Name: N/A  . Number of Children: N/A  . Years of Education: N/A   Occupational History  . Not on file.   Social History Main Topics  . Smoking status: Never Smoker   . Smokeless tobacco: Not on file  . Alcohol Use: No  . Drug Use: Not on file  . Sexual Activity: Not on file   Other Topics Concern  . Not on file   Social History Narrative   Married.   2 children.   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    Enjoys relaxing and spending time with her family.     Past Surgical History  Procedure Laterality Date  . Breast enhancement surgery      Family History  Problem Relation Age of Onset  . Hyperlipidemia Father     No Known Allergies  Current Outpatient Prescriptions on File Prior to Visit  Medication Sig Dispense Refill  . valACYclovir  (VALTREX) 500 MG tablet TAKE ONE TABLET BY MOUTH EVERY 24 HOURS.  9   No current facility-administered medications on file prior to visit.    BP 118/70 mmHg  Pulse 96  Temp(Src) 98.4 F (36.9 C) (Oral)  Ht 5\' 1"  (1.549 m)  Wt 135 lb 12.8 oz (61.598 kg)  BMI 25.67 kg/m2  SpO2 94%    Objective:   Physical Exam  Constitutional: She appears well-nourished.  Eyes: EOM are normal. Pupils are equal, round, and reactive to light.  Cardiovascular: Normal rate and regular rhythm.   Pulmonary/Chest: Effort normal and breath sounds normal.  Neurological: No cranial nerve deficit.  Skin: Skin is warm and dry.          Assessment & Plan:

## 2014-12-07 NOTE — Progress Notes (Signed)
Pre visit review using our clinic review tool, if applicable. No additional management support is needed unless otherwise documented below in the visit note. 

## 2014-12-07 NOTE — Assessment & Plan Note (Signed)
Some improvement on Topamax 25 mg. Migraine headache today which has improved with zofran she had from an old script and iburpofen that she took this morning. Will increase topamax to 50 mg HS as I believe we are not yet at goal. Toradol 30 mg IM provided in office today for current migraine. New RX for zofran provided today as needed. She is to notify me if headaches are not improved with the increase in dose of topamax.

## 2015-01-16 HISTORY — PX: AUGMENTATION MAMMAPLASTY: SUR837

## 2015-01-31 ENCOUNTER — Telehealth: Payer: Self-pay

## 2015-01-31 NOTE — Telephone Encounter (Signed)
PLEASE NOTE: All timestamps contained within this report are represented as [place] Standard Time. CONFIDENTIALTY NOTICE: This fax transmission is intended only for the addressee. It contains information that is legally privileged, confidential or otherwise protected from use or disclosure. If you are not the intended recipient, you are strictly prohibited from reviewing, disclosing, copying using or disseminating any of this information or taking any action in reliance on or regarding this information. If you have received this fax in error, please notify us immediately by telephone so that we can arrange for its return to [place]. Phone: [phone], Toll-Free: [phone], Fax: [phone] Page: 1 of 1 Call Id: [id number] [name] Patient Name: Danielle Saunders Gender: Female DOB: 10-20-1980 Age: 35 Y 55 M 7 D Return Phone Number: [phone] (Primary) Address: City/State/Zip: [place] [facility] - Client Client Site [facility] - Day Physician Alma Friendly Contact Type Call Call Type Triage / Clinical Relationship To Patient Self Appointment Disposition EMR Caller Not Reached Info pasted into Epic No Return Phone Number [phone] (Primary) Chief Complaint Headache Initial Comment Caller states she has migraine, usually comes in for shot, xfer from office for triage Nurse Assessment Guidelines Guideline Title Affirmed Question Affirmed Notes Nurse Date/Time ([place] Time) Disp. Time Eilene Ghazi Time) Disposition Final User 01/31/2015 10:47:56 AM Attempt made - no message left Markus Daft RN, Sherre Poot 01/31/2015 11:19:56 AM FINAL ATTEMPT MADE - message left Yes Markus Daft, RN, Sherre Poot After Care Instructions Given Call Event Type User Date / Time Description

## 2015-01-31 NOTE — Telephone Encounter (Signed)
Spoke with pt and she has taken ibuprofen and med for nausea and she is laying down and wants to see if h/a will go away; if h/a continues pt will cb. Does not want to schedule appt at this time.

## 2015-02-18 ENCOUNTER — Ambulatory Visit (INDEPENDENT_AMBULATORY_CARE_PROVIDER_SITE_OTHER): Payer: BLUE CROSS/BLUE SHIELD | Admitting: Primary Care

## 2015-02-18 ENCOUNTER — Encounter: Payer: Self-pay | Admitting: Primary Care

## 2015-02-18 VITALS — BP 122/76 | HR 76 | Temp 98.1°F | Ht 61.0 in | Wt 131.8 lb

## 2015-02-18 DIAGNOSIS — G43901 Migraine, unspecified, not intractable, with status migrainosus: Secondary | ICD-10-CM

## 2015-02-18 DIAGNOSIS — G44229 Chronic tension-type headache, not intractable: Secondary | ICD-10-CM | POA: Diagnosis not present

## 2015-02-18 DIAGNOSIS — Z23 Encounter for immunization: Secondary | ICD-10-CM

## 2015-02-18 DIAGNOSIS — Z0000 Encounter for general adult medical examination without abnormal findings: Secondary | ICD-10-CM

## 2015-02-18 LAB — COMPREHENSIVE METABOLIC PANEL
ALBUMIN: 4.4 g/dL (ref 3.5–5.2)
ALT: 13 U/L (ref 0–35)
AST: 16 U/L (ref 0–37)
Alkaline Phosphatase: 60 U/L (ref 39–117)
BILIRUBIN TOTAL: 0.5 mg/dL (ref 0.2–1.2)
BUN: 14 mg/dL (ref 6–23)
CALCIUM: 9.3 mg/dL (ref 8.4–10.5)
CO2: 25 mEq/L (ref 19–32)
CREATININE: 0.94 mg/dL (ref 0.40–1.20)
Chloride: 109 mEq/L (ref 96–112)
GFR: 72.67 mL/min (ref >60.00)
Glucose, Bld: 89 mg/dL (ref 70–99)
Potassium: 4 mEq/L (ref 3.5–5.1)
SODIUM: 141 meq/L (ref 135–145)
TOTAL PROTEIN: 7.3 g/dL (ref 6.0–8.3)

## 2015-02-18 LAB — CBC
HCT: 42.8 % (ref 36.0–46.0)
Hemoglobin: 13.9 g/dL (ref 12.0–15.0)
MCHC: 32.5 g/dL (ref 30.0–36.0)
MCV: 86.3 fl (ref 78.0–100.0)
PLATELETS: 280 10*3/uL (ref 150.0–400.0)
RBC: 4.96 Mil/uL (ref 3.87–5.11)
RDW: 14 % (ref 11.5–15.5)
WBC: 7 10*3/uL (ref 4.0–10.5)

## 2015-02-18 LAB — LIPID PANEL
CHOLESTEROL: 188 mg/dL (ref 0–200)
HDL: 58.4 mg/dL (ref >39.00)
LDL Cholesterol: 118 mg/dL — ABNORMAL HIGH (ref 0–99)
NONHDL: 129.45
Total CHOL/HDL Ratio: 3
Triglycerides: 58 mg/dL (ref 0.0–149.0)
VLDL: 11.6 mg/dL (ref 0.0–40.0)

## 2015-02-18 LAB — TSH: TSH: 2.22 u[IU]/mL (ref 0.35–4.50)

## 2015-02-18 LAB — VITAMIN D 25 HYDROXY (VIT D DEFICIENCY, FRACTURES): VITD: 32.17 ng/mL (ref 30.00–100.00)

## 2015-02-18 LAB — HEMOGLOBIN A1C: HEMOGLOBIN A1C: 5.1 % (ref 4.6–6.5)

## 2015-02-18 MED ORDER — SUMATRIPTAN SUCCINATE 25 MG PO TABS: ORAL_TABLET | ORAL | Status: DC

## 2015-02-18 NOTE — Assessment & Plan Note (Signed)
Tdap due today. Declines flu. Pap UTD.  Exam unremarkable.  Labs pending. Discussed the importance of a healthy diet and regular exercise in order for weight loss and to reduce risk of other medical diseases.  Follow up in 1 year for repeat physical.

## 2015-02-18 NOTE — Assessment & Plan Note (Signed)
Improved since increase in Topamax to 50 mg. Headaches are now once weekly and are tolerable. Continues to get migraines once monthly. Will send RX for Imitrex to use PRN migraines. Discussed to notify me if headaches become more frequent or at an increased intensity.

## 2015-02-18 NOTE — Progress Notes (Signed)
Subjective:    Patient ID: Danielle Saunders, female    DOB: [birth year], 35 y.o.   MRN: [id number]  HPI  Danielle Saunders is a 35 year old female who presents today for complete physical.  Immunizations: -Tetanus: Completed in 2004, due today. -Influenza: Declines   Diet: Endorses a fair diet. Breakfast: Breakfast bars, eggs and bacon Lunch: Fast food or left overs Dinner: Chicken breast, japanese, pasta, vegetables Snacks: None Desserts: once to twice weekly Beverages: Coffee, milk, water, sweet tea  Exercise: She does not currently exercise. Eye exam: Completed several months ago.  Dental exam: Completed 1 week ago. Pap Smear: Completed several months ago and normal.     Review of Systems  HENT: Negative for rhinorrhea.   Respiratory: Negative for cough and shortness of breath.   Cardiovascular: Negative for chest pain.  Gastrointestinal: Negative for diarrhea and constipation.  Genitourinary: Negative for difficulty urinating.       No periods with IUD  Musculoskeletal: Negative for myalgias and arthralgias.  Skin: Negative for rash.  Allergic/Immunologic: Negative for environmental allergies.  Neurological: Negative for dizziness.  Psychiatric/Behavioral:       Denies concerns for anxiety or depression       Past Medical History  Diagnosis Date  . Chronic tension headaches   . Genital herpes     Social History   Social History  . Marital Status: Married    Spouse Name: N/A  . Number of Children: N/A  . Years of Education: N/A   Occupational History  . Not on file.   Social History Main Topics  . Smoking status: Never Smoker   . Smokeless tobacco: Not on file  . Alcohol Use: No  . Drug Use: Not on file  . Sexual Activity: Not on file   Other Topics Concern  . Not on file   Social History Narrative   Married.   2 children.   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    Enjoys relaxing and spending time with her family.     Past Surgical History  Procedure  Laterality Date  . Breast enhancement surgery      Family History  Problem Relation Age of Onset  . Hyperlipidemia Father     No Known Allergies  Current Outpatient Prescriptions on File Prior to Visit  Medication Sig Dispense Refill  . ondansetron (ZOFRAN) 4 MG tablet Take 1 tablet (4 mg total) by mouth every 8 (eight) hours as needed for nausea or vomiting. 20 tablet 2  . topiramate (TOPAMAX) 50 MG tablet Take 1 tablet (50 mg total) by mouth at bedtime. 30 tablet 5  . valACYclovir (VALTREX) 500 MG tablet TAKE ONE TABLET BY MOUTH EVERY 24 HOURS.  9   No current facility-administered medications on file prior to visit.    BP 122/76 mmHg  Pulse 76  Temp(Src) 98.1 F (36.7 C) (Oral)  Ht 5\' 1"  (1.549 m)  Wt 131 lb 12.8 oz (59.784 kg)  BMI 24.92 kg/m2  SpO2 98%    Objective:   Physical Exam  Constitutional: She is oriented to person, place, and time. She appears well-nourished.  HENT:  Right Ear: Tympanic membrane and ear canal normal.  Left Ear: Tympanic membrane and ear canal normal.  Nose: Nose normal.  Mouth/Throat: Oropharynx is clear and moist.  Eyes: Conjunctivae and EOM are normal. Pupils are equal, round, and reactive to light.  Neck: Neck supple. No thyromegaly present.  Cardiovascular: Normal rate and regular rhythm.   No murmur  heard. Pulmonary/Chest: Effort normal and breath sounds normal. She has no rales.  Abdominal: Soft. Bowel sounds are normal. There is no tenderness.  Musculoskeletal: Normal range of motion.  Lymphadenopathy:    She has no cervical adenopathy.  Neurological: She is alert and oriented to person, place, and time. She has normal reflexes. No cranial nerve deficit.  Skin: Skin is warm and dry. No rash noted.  Psychiatric: She has a normal mood and affect.          Assessment & Plan:

## 2015-02-18 NOTE — Patient Instructions (Signed)
Continue Topamax 50 mg at bedtime for headache prevention. Please notify me if your headaches become more frequent or increase in intensity.  You may use the sumatriptain (imitrex) tablets as needed for severe migraine. Take 1 tablet at onset of migraine. May repeat in 2 hours if no improvement. Do not exceed 2 tablets in 24 hours.  Complete lab work prior to leaving today. I will notify you of your results once received.   Follow up in 1 year for repeat physical or sooner if needed.  It was a pleasure to see you today!

## 2015-02-18 NOTE — Addendum Note (Signed)
Addended by: Marchia Bond on: 02/18/2015 01:46 PM   Modules accepted: Miquel Dunn

## 2015-02-21 ENCOUNTER — Encounter: Payer: Self-pay | Admitting: *Deleted

## 2015-06-07 ENCOUNTER — Ambulatory Visit (INDEPENDENT_AMBULATORY_CARE_PROVIDER_SITE_OTHER): Payer: BLUE CROSS/BLUE SHIELD | Admitting: Primary Care

## 2015-06-07 ENCOUNTER — Encounter: Payer: Self-pay | Admitting: Primary Care

## 2015-06-07 VITALS — BP 118/82 | HR 85 | Temp 97.6°F | Ht 61.0 in | Wt 129.0 lb

## 2015-06-07 DIAGNOSIS — G44229 Chronic tension-type headache, not intractable: Secondary | ICD-10-CM

## 2015-06-07 MED ORDER — TOPIRAMATE 100 MG PO TABS: 100.0000 mg | ORAL_TABLET | Freq: Every day | ORAL | Status: DC

## 2015-06-07 MED ORDER — SUMATRIPTAN SUCCINATE 50 MG PO TABS: ORAL_TABLET | ORAL | Status: DC

## 2015-06-07 MED ORDER — KETOROLAC TROMETHAMINE 60 MG/2ML IM SOLN
60.0000 mg | Freq: Once | INTRAMUSCULAR | Status: AC
Start: 2015-06-07 — End: 2015-06-07
  Administered 2015-06-07: 60 mg via INTRAMUSCULAR

## 2015-06-07 NOTE — Progress Notes (Signed)
   Subjective:    Patient ID: Ignacia Felling, female    DOB: 08/14/1980, 35 y.o.   MRN: [id number]  HPI  Ms. Seppala is a 35 year old female who presents today with a chief complaint of headache. She has a history of chronic tension-type headaches and is managed on Topamax 50 mg daily. She has a prescription for Imitrex 25 mg to use PRN.   Over the past 1-2 months she's experiencing headaches intermittently that are lasting 1-3 days. She is compliant to her Topamax and Imitrex. She's not noticed improvement with the Imitrex for these headaches. Her headaches are located to her entire headache and she describes her pain as throbbing and pressure. She has photophobia and nausea. She's also tried taking tylenol and ibuprofen without improvement.  Review of Systems  Eyes: Positive for photophobia.  Respiratory: Negative for shortness of breath.   Cardiovascular: Negative for chest pain.  Gastrointestinal: Positive for nausea.  Neurological: Positive for headaches. Negative for dizziness.       Past Medical History  Diagnosis Date  . Chronic tension headaches   . Genital herpes      Social History   Social History  . Marital Status: Married    Spouse Name: N/A  . Number of Children: N/A  . Years of Education: N/A   Occupational History  . Not on file.   Social History Main Topics  . Smoking status: Never Smoker   . Smokeless tobacco: Not on file  . Alcohol Use: No  . Drug Use: Not on file  . Sexual Activity: Not on file   Other Topics Concern  . Not on file   Social History Narrative   Married.   2 children.   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    Enjoys relaxing and spending time with her family.     Past Surgical History  Procedure Laterality Date  . Breast enhancement surgery      Family History  Problem Relation Age of Onset  . Hyperlipidemia Father     No Known Allergies  Current Outpatient Prescriptions on File Prior to Visit  Medication Sig Dispense Refill    . ondansetron (ZOFRAN) 4 MG tablet Take 1 tablet (4 mg total) by mouth every 8 (eight) hours as needed for nausea or vomiting. 20 tablet 2  . valACYclovir (VALTREX) 500 MG tablet TAKE ONE TABLET BY MOUTH EVERY 24 HOURS.  9   No current facility-administered medications on file prior to visit.    BP 118/82 mmHg  Pulse 85  Temp(Src) 97.6 F (36.4 C) (Oral)  Ht 5\' 1"  (1.549 m)  Wt 129 lb (58.514 kg)  BMI 24.39 kg/m2  SpO2 98%    Objective:   Physical Exam  Constitutional: She appears well-nourished.  Eyes: EOM are normal. Pupils are equal, round, and reactive to light.  Neck: Neck supple.  Cardiovascular: Normal rate and regular rhythm.   Pulmonary/Chest: Effort normal and breath sounds normal.  Neurological: No cranial nerve deficit.  Skin: Skin is warm and dry.          Assessment & Plan:

## 2015-06-07 NOTE — Assessment & Plan Note (Signed)
Worse over the past 1-2 months, no improvement with Topamax and Imitrex. Will increase Topamax to 100 mg and Imitrex to 50 mg. IM toradol 60 mg provided today. She is to update me in 2-3 weeks. Discussed stress relieving techniques as I believe stress to be contributing. Neuro exam unremarkable.

## 2015-06-07 NOTE — Patient Instructions (Addendum)
We've increased your Imitrex from 25 mg to 50 mg. Take 1 tablet at migraine onset, may repeat in 2 hours if headache persists. Do not exceed 2 tablets in 24 hours.  We've increased your Topamax from 50 mg to 100 mg. Take 1 tablet by mouth every night at bedtime.  You've been provided with an injection of Toradol for your headache. Do not take any ibuprofen, aleve, or advil today.  Please call me with an update in your headaches in 2-3 weeks.  You MUST take time for yourself at least once daily every week. This will help to reduce your symptoms and improve your mood.  It was a pleasure to see you today!

## 2015-06-07 NOTE — Progress Notes (Signed)
Pre visit review using our clinic review tool, if applicable. No additional management support is needed unless otherwise documented below in the visit note. 

## 2015-06-21 ENCOUNTER — Telehealth: Payer: Self-pay | Admitting: Primary Care

## 2015-06-21 NOTE — Telephone Encounter (Signed)
Noted  

## 2015-06-21 NOTE — Telephone Encounter (Signed)
Spoken to patient and she stated that she did not have a headache since the last time she was seen. So far patient is doing okay. She feels like she may be starting a headache today but not bad.

## 2015-06-21 NOTE — Telephone Encounter (Signed)
-----   Message from Pleas Koch, NP sent at 06/07/2015 11:09 AM EDT ----- Regarding: Headaches Will you please check on Danielle Saunders headaches?

## 2015-10-04 ENCOUNTER — Other Ambulatory Visit: Payer: Self-pay | Admitting: Primary Care

## 2015-10-04 ENCOUNTER — Encounter: Payer: Self-pay | Admitting: Primary Care

## 2015-10-04 DIAGNOSIS — IMO0002 Reserved for concepts with insufficient information to code with codable children: Secondary | ICD-10-CM

## 2015-10-04 DIAGNOSIS — G43709 Chronic migraine without aura, not intractable, without status migrainosus: Secondary | ICD-10-CM

## 2015-10-12 ENCOUNTER — Encounter: Payer: Self-pay | Admitting: Primary Care

## 2015-10-12 ENCOUNTER — Ambulatory Visit (INDEPENDENT_AMBULATORY_CARE_PROVIDER_SITE_OTHER)
Admission: RE | Admit: 2015-10-12 | Discharge: 2015-10-12 | Disposition: A | Discharge status: Home / Self Care | Payer: BLUE CROSS/BLUE SHIELD | Source: Ambulatory Visit | Attending: Primary Care | Admitting: Primary Care

## 2015-10-12 ENCOUNTER — Other Ambulatory Visit: Payer: Self-pay | Admitting: Primary Care

## 2015-10-12 DIAGNOSIS — G44229 Chronic tension-type headache, not intractable: Secondary | ICD-10-CM

## 2015-10-12 DIAGNOSIS — IMO0002 Reserved for concepts with insufficient information to code with codable children: Secondary | ICD-10-CM

## 2015-10-12 DIAGNOSIS — G43709 Chronic migraine without aura, not intractable, without status migrainosus: Secondary | ICD-10-CM | POA: Diagnosis not present

## 2015-10-12 DIAGNOSIS — R51 Headache: Secondary | ICD-10-CM | POA: Diagnosis not present

## 2015-10-12 MED ORDER — TOPIRAMATE 50 MG PO TABS: 50.0000 mg | ORAL_TABLET | Freq: Every day | ORAL | 0 refills | Status: DC

## 2015-12-02 ENCOUNTER — Ambulatory Visit: Payer: BLUE CROSS/BLUE SHIELD | Admitting: Primary Care

## 2015-12-29 ENCOUNTER — Other Ambulatory Visit: Payer: Self-pay | Admitting: Primary Care

## 2015-12-29 DIAGNOSIS — G44229 Chronic tension-type headache, not intractable: Secondary | ICD-10-CM

## 2015-12-30 DIAGNOSIS — H5213 Myopia, bilateral: Secondary | ICD-10-CM | POA: Diagnosis not present

## 2016-01-03 ENCOUNTER — Other Ambulatory Visit: Payer: Self-pay | Admitting: Primary Care

## 2016-01-03 DIAGNOSIS — G44229 Chronic tension-type headache, not intractable: Secondary | ICD-10-CM

## 2016-01-03 NOTE — Telephone Encounter (Signed)
Ok to refill? Electronically refill request for   topiramate (TOPAMAX) 50 MG tablet  Last prescribed on 10/12/2015.  topiramate (TOPAMAX) 100 MG tablet  Last prescribed on 06/07/2015.  Last seen on 06/07/2015.

## 2016-01-12 ENCOUNTER — Other Ambulatory Visit: Payer: Self-pay | Admitting: Primary Care

## 2016-01-12 DIAGNOSIS — G44229 Chronic tension-type headache, not intractable: Secondary | ICD-10-CM

## 2016-02-17 ENCOUNTER — Encounter: Payer: Self-pay | Admitting: Primary Care

## 2016-02-24 ENCOUNTER — Other Ambulatory Visit: Payer: Self-pay | Admitting: Primary Care

## 2016-02-24 DIAGNOSIS — G43901 Migraine, unspecified, not intractable, with status migrainosus: Secondary | ICD-10-CM

## 2016-02-24 DIAGNOSIS — R51 Headache: Principal | ICD-10-CM

## 2016-02-24 DIAGNOSIS — Z23 Encounter for immunization: Secondary | ICD-10-CM | POA: Diagnosis not present

## 2016-02-24 DIAGNOSIS — R519 Headache, unspecified: Secondary | ICD-10-CM

## 2016-02-24 MED ORDER — AMITRIPTYLINE HCL 10 MG PO TABS: ORAL_TABLET | ORAL | 1 refills | Status: DC

## 2016-02-24 MED ORDER — ZOLMITRIPTAN 5 MG NA SOLN: NASAL | 0 refills | Status: DC

## 2016-02-27 ENCOUNTER — Ambulatory Visit: Payer: BLUE CROSS/BLUE SHIELD | Admitting: Family Medicine

## 2016-02-27 ENCOUNTER — Encounter: Payer: Self-pay | Admitting: Family Medicine

## 2016-02-27 ENCOUNTER — Ambulatory Visit (INDEPENDENT_AMBULATORY_CARE_PROVIDER_SITE_OTHER): Payer: BLUE CROSS/BLUE SHIELD | Admitting: Family Medicine

## 2016-02-27 VITALS — BP 104/80 | HR 102 | Temp 98.7°F | Resp 12 | Ht 61.0 in | Wt 120.2 lb

## 2016-02-27 DIAGNOSIS — J069 Acute upper respiratory infection, unspecified: Secondary | ICD-10-CM

## 2016-02-27 DIAGNOSIS — L538 Other specified erythematous conditions: Secondary | ICD-10-CM

## 2016-02-27 LAB — POCT INFLUENZA A/B
INFLUENZA B, POC: NEGATIVE
Influenza A, POC: NEGATIVE

## 2016-02-27 MED ORDER — BENZONATATE 100 MG PO CAPS: 200.0000 mg | ORAL_CAPSULE | Freq: Two times a day (BID) | ORAL | 0 refills | Status: DC | PRN

## 2016-02-27 NOTE — Progress Notes (Signed)
Pre visit review using our clinic review tool, if applicable. No additional management support is needed unless otherwise documented below in the visit note. 

## 2016-02-27 NOTE — Progress Notes (Signed)
HPI:  ACUTE VISIT  Chief Complaint  Patient presents with  . Cough    [name] is a 36 y.o.female here today complaining of a day of respiratory symptoms.  Mild productive cough with clear mucus. Mild nasal congestion, rhinorrhea, sore throat, and post nasal drainage. + Fever (99 F today) and body aches. + Dysphonia.  She has not noted chest pain, dyspnea, or wheezing.  Also noted macular erythematous rash around her neck. According to patient, she gets these same rash every time" she has an acute illness. It is not tender or pruritic and usually resolves after she recovers.  No Hx of recent travel. No sick contact. No known insect bite.  Hx of allergies: No  OTC medications for this problem: Tylenol last night.  Symptoms otherwise stable.  Review of Systems  Constitutional: Positive for fatigue and fever. Negative for appetite change.  HENT: Positive for congestion, postnasal drip, rhinorrhea, sore throat and voice change. Negative for ear pain, mouth sores, sinus pressure and trouble swallowing.   Eyes: Negative for discharge and redness.  Respiratory: Positive for cough. Negative for shortness of breath and wheezing.   Cardiovascular: Negative for chest pain.  Gastrointestinal: Negative for abdominal pain, diarrhea, nausea and vomiting.  Musculoskeletal: Positive for myalgias. Negative for joint swelling and neck pain.  Skin: Positive for rash.  Neurological: Negative for syncope, weakness and headaches (no more than usual).  Hematological: Negative for adenopathy. Does not bruise/bleed easily.  Psychiatric/Behavioral: Negative for confusion.      Current Outpatient Prescriptions on File Prior to Visit  Medication Sig Dispense Refill  . amitriptyline (ELAVIL) 10 MG tablet Take 1 tablet by mouth every night at bedtime for headache prevention. 30 tablet 1  . ondansetron (ZOFRAN) 4 MG tablet Take 1 tablet (4 mg total) by mouth every 8 (eight) hours  as needed for nausea or vomiting. 20 tablet 2  . SUMAtriptan (IMITREX) 50 MG tablet TAKE ONE TABLET BY MOUTH AT MIGRAINE ONSET. MAY REPEAT IN 2 HOURS IN 2 HOURS IF HEADACHE PERSISTS. DO NOT EXCEED 2 TABLETS IN 24 HOURS. 10 tablet 1  . valACYclovir (VALTREX) 500 MG tablet TAKE ONE TABLET BY MOUTH EVERY 24 HOURS.  9  . zolmitriptan (ZOMIG) 5 MG nasal solution Instill 1 spray at migraine onset, may repeat in 2 hours if migraine persists. 6 Units 0   No current facility-administered medications on file prior to visit.      Past Medical History:  Diagnosis Date  . Chronic tension headaches   . Genital herpes    No Known Allergies  Social History   Social History  . Marital status: Married    Spouse name: N/A  . Number of children: N/A  . Years of education: N/A   Social History Main Topics  . Smoking status: Never Smoker  . Smokeless tobacco: Never Used  . Alcohol use No  . Drug use: Unknown  . Sexual activity: Not Asked   Other Topics Concern  . None   Social History Narrative   Married.   2 children.   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    Enjoys relaxing and spending time with her family.     Vitals:   02/27/16 1050  BP: 104/80  Pulse: (!) 102  Resp: 12  Temp: 98.7 F (37.1 C)  O2 sat at RA 98%. Body mass index is 22.72 kg/m.   Physical Exam  Nursing note and vitals reviewed. Constitutional: She is oriented to  person, place, and time. She appears well-developed and well-nourished. She does not appear ill. No distress.  HENT:  Head: Atraumatic.  Mouth/Throat: Uvula is midline and mucous membranes are normal. Posterior oropharyngeal erythema (mild) present. No oropharyngeal exudate or posterior oropharyngeal edema.  Mild dysphonia.  Eyes: Conjunctivae and EOM are normal.  Neck: No muscular tenderness present. No edema present.  Cardiovascular: Regular rhythm.  Tachycardia present.   No murmur heard. Respiratory: Effort normal and breath sounds normal. No  stridor. No respiratory distress.  Musculoskeletal: She exhibits no edema.  Lymphadenopathy:       Head (right side): No submandibular adenopathy present.       Head (left side): No submandibular adenopathy present.    She has cervical adenopathy.       Right cervical: Posterior cervical adenopathy present.       Left cervical: Posterior cervical adenopathy present.  Neurological: She is alert and oriented to person, place, and time. She has normal strength. Gait normal.  Skin: Skin is warm. Rash noted. No ecchymosis and no purpura noted. Rash is macular. Rash is not vesicular. There is erythema.     Macular confluent erythematous rash on lateral aspects of neck, bilateral, and under breasts. No tender, no local heat or induration.  Psychiatric: She has a normal mood and affect. Her speech is normal.  Well groomed, good eye contact.      ASSESSMENT AND PLAN:   Euretta was seen today for cough.  Diagnoses and all orders for this visit:   URI, acute  Symptoms suggests a viral etiology,. Monitor for new symptoms.       I explained patient that symptomatic treatment is usually recommended in this case. Rapid flu negative. Note for work given. Voice rest.  Instructed to monitor for signs of complications, instructed about warning signs. I also explained that cough and nasal congestion can last a few days and sometimes weeks. F/U as needed.   -     benzonatate (TESSALON) 100 MG capsule; Take 2 capsules (200 mg total) by mouth 2 (two) times daily as needed for cough.  Macular erythematous rash  Reported as chronic intermittent. Monitor for now. F/U as needed.     -Ms. Danielle Saunders was advised to return or notify a doctor immediately if symptoms worsen or persist or new concerns arise.       Tristram Milian G. Martin[place], MD  [facility]. [place] office.

## 2016-02-27 NOTE — Patient Instructions (Addendum)
  [name] I have seen you today for an acute visit.  A few things to remember from today's visit:   URI, acute - Plan: benzonatate (TESSALON) 100 MG capsule  Macular erythematous rash  Flu test negative.   Viral infections are self-limited and we treat each symptom depending of severity.  Monitor for new symptoms.  Over the counter medications as decongestants and cold medications usually help, they need to be taken with caution if there is a history of high blood pressure or palpitations. Tylenol and/or Ibuprofen also helps with most symptoms (headache, muscle aching, fever,etc) Plenty of fluids. Honey helps with cough. Steam inhalations helps with runny nose, nasal congestion, and may prevent sinus infections. Cough and nasal congestion could last a few days and sometimes weeks. Please follow in not any better in 1-2 weeks or if symptoms get worse.   Voice rest.   Medications prescribed today are intended for short period of time and will not be refill upon request, a follow up appointment might be necessary to discuss continuation of of treatment if appropriate.  If nausea develops in the next 24-48 hours please let me know.   In general please monitor for signs of worsening symptoms and seek immediate medical attention if any concerning.

## 2016-02-27 NOTE — Addendum Note (Signed)
Addended by: Kateri Mc E on: 02/27/2016 01:04 PM   Modules accepted: Orders

## 2016-02-28 ENCOUNTER — Ambulatory Visit: Payer: BLUE CROSS/BLUE SHIELD | Admitting: Primary Care

## 2016-03-01 ENCOUNTER — Encounter: Payer: Self-pay | Admitting: Primary Care

## 2016-03-01 DIAGNOSIS — G43901 Migraine, unspecified, not intractable, with status migrainosus: Secondary | ICD-10-CM

## 2016-03-05 NOTE — Telephone Encounter (Signed)
Please see My Chart message, looks like it's needing a prior auth.

## 2016-03-07 NOTE — Telephone Encounter (Signed)
Send Prior auth on 03/06/2016.  Waiting on responds.

## 2016-03-07 NOTE — Telephone Encounter (Signed)
Gave paperwork to [facility] for review and will complete if need be.

## 2016-03-07 NOTE — Telephone Encounter (Signed)
Danielle Saunders with [employer] re: PA zomig; need additonal inforamation; request cb at [phone] ext [phone] and refer to ref # [id number].

## 2016-03-08 NOTE — Telephone Encounter (Signed)
Notified patient that it is recommended from [facility] for patient to try other certain medication. There are medications such as Imitrex nsal spray or mamalt tablets that are on her Triptan quantity limits for 90 days.  Patient stated that she will try which ever Anda Kraft thinks it is best for her headaches.

## 2016-03-08 NOTE — Telephone Encounter (Signed)
Danielle Saunders with [employer] left v/m requesting additional info for PA; Danielle Saunders contact # [phone] x [phone]. Danielle Saunders is faxing back the form to be completed and refaxed to [phone]; use ref # [id number]. Did not leave name of med.

## 2016-03-30 MED ORDER — RIZATRIPTAN BENZOATE 5 MG PO TABS: ORAL_TABLET | ORAL | 0 refills | Status: DC

## 2016-03-30 NOTE — Telephone Encounter (Signed)
Noted, maxalt sent to pharmacy. Updated patient through My Chart.

## 2016-03-30 NOTE — Telephone Encounter (Signed)
Patient said she didn't receive the medication for her headaches.  She said it's for the onset of a headache.  She checked with the pharmacy yesterday.  Patient said she woke up with a headache this morning and would like to have it called in to [place].  Patient can be reached at [phone].

## 2016-03-30 NOTE — Telephone Encounter (Signed)
Spoken to patient and she stated that she would like to try Maxalt first.

## 2016-04-09 ENCOUNTER — Other Ambulatory Visit: Payer: Self-pay | Admitting: Obstetrics and Gynecology

## 2016-04-09 ENCOUNTER — Other Ambulatory Visit (HOSPITAL_COMMUNITY)
Admission: RE | Admit: 2016-04-09 | Discharge: 2016-04-09 | Disposition: A | Discharge status: Home / Self Care | Payer: BLUE CROSS/BLUE SHIELD | Source: Ambulatory Visit | Attending: Obstetrics and Gynecology | Admitting: Obstetrics and Gynecology

## 2016-04-09 DIAGNOSIS — Z1151 Encounter for screening for human papillomavirus (HPV): Secondary | ICD-10-CM | POA: Insufficient documentation

## 2016-04-09 DIAGNOSIS — Z01419 Encounter for gynecological examination (general) (routine) without abnormal findings: Secondary | ICD-10-CM | POA: Diagnosis not present

## 2016-04-11 LAB — CYTOLOGY - PAP
Adequacy: ABSENT
Diagnosis: NEGATIVE
HPV: NOT DETECTED

## 2016-04-24 ENCOUNTER — Ambulatory Visit (INDEPENDENT_AMBULATORY_CARE_PROVIDER_SITE_OTHER): Payer: BLUE CROSS/BLUE SHIELD | Admitting: Primary Care

## 2016-04-24 ENCOUNTER — Encounter: Payer: Self-pay | Admitting: Primary Care

## 2016-04-24 VITALS — BP 114/82 | HR 76 | Temp 98.1°F | Ht 61.0 in | Wt 126.1 lb

## 2016-04-24 DIAGNOSIS — G44229 Chronic tension-type headache, not intractable: Secondary | ICD-10-CM | POA: Diagnosis not present

## 2016-04-24 DIAGNOSIS — G8929 Other chronic pain: Secondary | ICD-10-CM

## 2016-04-24 DIAGNOSIS — R51 Headache: Secondary | ICD-10-CM

## 2016-04-24 DIAGNOSIS — R519 Headache, unspecified: Secondary | ICD-10-CM

## 2016-04-24 MED ORDER — KETOROLAC TROMETHAMINE 60 MG/2ML IM SOLN
60.0000 mg | Freq: Once | INTRAMUSCULAR | Status: AC
  Administered 2016-04-24: 60 mg via INTRAMUSCULAR

## 2016-04-24 MED ORDER — RIZATRIPTAN BENZOATE 10 MG PO TABS: ORAL_TABLET | ORAL | 0 refills | Status: DC

## 2016-04-24 NOTE — Addendum Note (Signed)
Addended by: Jacqualin Combes on: 04/24/2016 12:51 PM   Modules accepted: Orders

## 2016-04-24 NOTE — Patient Instructions (Signed)
Stop by the front desk and speak with either Rosaria Ferries or Shirlean Mylar regarding your referral to the Headache Clinic.  We've increased the dose of your Maxalt to 10 mg. I sent a new prescription to your pharmacy. You may take two of the 5 mg tablets until your current prescription is out.  You were provided with an injection of Toradol to abort headache.  It was a pleasure to see you today!

## 2016-04-24 NOTE — Progress Notes (Signed)
Pre visit review using our clinic review tool, if applicable. No additional management support is needed unless otherwise documented below in the visit note. 

## 2016-04-24 NOTE — Assessment & Plan Note (Signed)
Little improvement with amitriptyline which is causing drowsiness in the AM. Little improvement with Maxalt. Given several failed attempts to treat recurrent headaches, will send to Neurology for further evaluation and treatment. Will increase Maxalt to 10 mg to use PRN for migraines. IM toradol provided today to abort migraine.

## 2016-04-24 NOTE — Progress Notes (Signed)
   Subjective:    Patient ID: Danielle Saunders, female    DOB: [birth year], 36 y.o.   MRN: [id number]  HPI  Danielle Saunders is a 36 year old female with a history of chronic headaches and migraines who presents today with a chief complaint of headache. Her headache is located to the bilateral temporal region with radiation to the bilateral occipital region. This started two days ago. She's been taking amitriptyline nightly, Maxalt (2 doses), tylenol, and ibuprofen with little improvement. She's currently experiencing nausea and photophobia. The nausea has improved since taking Zofran this morning.  She is currently managed on amitriptyline 10 mg HS and Maxalt 5 mg as needed for breakthrough migraines. She was initiated on amitriptyline two months ago and is experiencing headaches every other week. She's experienced 3-4 migraines monthly since starting amitriptyline. She has noticed drowsiness in the morning which was not a problem before starting the amitriptyline. Overall she's not noticed a significant improvement in prevention of headaches/migraines.  Review of Systems  Eyes: Positive for photophobia.  Gastrointestinal: Positive for nausea. Negative for vomiting.  Neurological: Positive for headaches. Negative for dizziness.       Past Medical History:  Diagnosis Date  . Chronic tension headaches   . Genital herpes      Social History   Social History  . Marital status: Married    Spouse name: N/A  . Number of children: N/A  . Years of education: N/A   Occupational History  . Not on file.   Social History Main Topics  . Smoking status: Never Smoker  . Smokeless tobacco: Never Used  . Alcohol use No  . Drug use: Unknown  . Sexual activity: Not on file   Other Topics Concern  . Not on file   Social History Narrative   Married.   2 children.   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    Enjoys relaxing and spending time with her family.     Past Surgical History:  Procedure Laterality  Date  . BREAST ENHANCEMENT SURGERY      Family History  Problem Relation Age of Onset  . Hyperlipidemia Father     No Known Allergies  Current Outpatient Prescriptions on File Prior to Visit  Medication Sig Dispense Refill  . amitriptyline (ELAVIL) 10 MG tablet Take 1 tablet by mouth every night at bedtime for headache prevention. 30 tablet 1  . ondansetron (ZOFRAN) 4 MG tablet Take 1 tablet (4 mg total) by mouth every 8 (eight) hours as needed for nausea or vomiting. 20 tablet 2  . valACYclovir (VALTREX) 500 MG tablet TAKE ONE TABLET BY MOUTH EVERY 24 HOURS.  9   No current facility-administered medications on file prior to visit.     BP 114/82   Pulse 76   Temp 98.1 F (36.7 C) (Oral)   Ht 5\' 1"  (1.549 m)   Wt 126 lb 1.9 oz (57.2 kg)   SpO2 98%   BMI 23.83 kg/m    Objective:   Physical Exam  Constitutional: She appears well-nourished.  Eyes: EOM are normal. Pupils are equal, round, and reactive to light.  Cardiovascular: Normal rate and regular rhythm.   Pulmonary/Chest: Effort normal and breath sounds normal.  Neurological: No cranial nerve deficit.          Assessment & Plan:

## 2016-04-27 ENCOUNTER — Other Ambulatory Visit: Payer: Self-pay | Admitting: Primary Care

## 2016-04-27 DIAGNOSIS — R51 Headache: Principal | ICD-10-CM

## 2016-04-27 DIAGNOSIS — R519 Headache, unspecified: Secondary | ICD-10-CM

## 2016-04-27 NOTE — Telephone Encounter (Signed)
Please advise if okay for pt to continue--last filled 02/24/16 with 1 refill-- you referred pt to neuro

## 2016-05-24 DIAGNOSIS — Z79899 Other long term (current) drug therapy: Secondary | ICD-10-CM | POA: Diagnosis not present

## 2016-05-24 DIAGNOSIS — G43019 Migraine without aura, intractable, without status migrainosus: Secondary | ICD-10-CM | POA: Diagnosis not present

## 2016-05-24 DIAGNOSIS — R51 Headache: Secondary | ICD-10-CM | POA: Diagnosis not present

## 2016-05-24 DIAGNOSIS — Z049 Encounter for examination and observation for unspecified reason: Secondary | ICD-10-CM | POA: Diagnosis not present

## 2016-06-14 DIAGNOSIS — M791 Myalgia: Secondary | ICD-10-CM | POA: Diagnosis not present

## 2016-06-14 DIAGNOSIS — M542 Cervicalgia: Secondary | ICD-10-CM | POA: Diagnosis not present

## 2016-06-14 DIAGNOSIS — G43019 Migraine without aura, intractable, without status migrainosus: Secondary | ICD-10-CM | POA: Diagnosis not present

## 2016-06-14 DIAGNOSIS — G518 Other disorders of facial nerve: Secondary | ICD-10-CM | POA: Diagnosis not present

## 2016-07-03 DIAGNOSIS — M791 Myalgia: Secondary | ICD-10-CM | POA: Diagnosis not present

## 2016-07-03 DIAGNOSIS — M542 Cervicalgia: Secondary | ICD-10-CM | POA: Diagnosis not present

## 2016-07-03 DIAGNOSIS — G518 Other disorders of facial nerve: Secondary | ICD-10-CM | POA: Diagnosis not present

## 2016-07-03 DIAGNOSIS — G43019 Migraine without aura, intractable, without status migrainosus: Secondary | ICD-10-CM | POA: Diagnosis not present

## 2016-07-12 ENCOUNTER — Encounter: Payer: Self-pay | Admitting: Primary Care

## 2016-07-19 DIAGNOSIS — M542 Cervicalgia: Secondary | ICD-10-CM | POA: Diagnosis not present

## 2016-07-19 DIAGNOSIS — M791 Myalgia: Secondary | ICD-10-CM | POA: Diagnosis not present

## 2016-07-19 DIAGNOSIS — G518 Other disorders of facial nerve: Secondary | ICD-10-CM | POA: Diagnosis not present

## 2016-07-19 DIAGNOSIS — G43019 Migraine without aura, intractable, without status migrainosus: Secondary | ICD-10-CM | POA: Diagnosis not present

## 2016-09-10 DIAGNOSIS — M542 Cervicalgia: Secondary | ICD-10-CM | POA: Diagnosis not present

## 2016-09-10 DIAGNOSIS — G43019 Migraine without aura, intractable, without status migrainosus: Secondary | ICD-10-CM | POA: Diagnosis not present

## 2016-09-10 DIAGNOSIS — M791 Myalgia: Secondary | ICD-10-CM | POA: Diagnosis not present

## 2016-09-10 DIAGNOSIS — G518 Other disorders of facial nerve: Secondary | ICD-10-CM | POA: Diagnosis not present

## 2016-09-12 ENCOUNTER — Encounter: Payer: Self-pay | Admitting: Primary Care

## 2016-09-12 ENCOUNTER — Ambulatory Visit (INDEPENDENT_AMBULATORY_CARE_PROVIDER_SITE_OTHER): Payer: BLUE CROSS/BLUE SHIELD | Admitting: Primary Care

## 2016-09-12 DIAGNOSIS — G44229 Chronic tension-type headache, not intractable: Secondary | ICD-10-CM

## 2016-09-12 MED ORDER — KETOROLAC TROMETHAMINE 60 MG/2ML IM SOLN
60.0000 mg | Freq: Once | INTRAMUSCULAR | Status: AC
  Administered 2016-09-12: 60 mg via INTRAMUSCULAR

## 2016-09-12 NOTE — Assessment & Plan Note (Signed)
Following with the headache clinic, managed on injections. Migraine present today, will abort with Toradol IM 60 mg today. She will call if no improvement in 24 hours. Neuro exam unremarkable.

## 2016-09-12 NOTE — Patient Instructions (Signed)
You were provided with an injection of Toradol to abort your migraine.  Please discuss your migraines with the headache clinic during your next visit.  Call me in 24 hours if no improvement in your migraine.  It was a pleasure to see you today!

## 2016-09-12 NOTE — Progress Notes (Signed)
   Subjective:    Patient ID: Danielle Saunders, female    DOB: [birth year], 36 y.o.   MRN: [id number]  HPI  Danielle Saunders is a 36 year old female with a history of migraines who presents today with a chief complaint of migraine. She is currently managed on amitriptyline 10 mg HS, Maxalt 10 mg PRN, and Zofran PRN. During her last visit in April 2018 she was referred to Neurology given recurrent migraines despite treatment.  Her migraine began to the bilateral occipital head about 2 days ago which has since increased to entire head. She's been taking a "pain medication" that her neurologist provided, also Zofran without improvement. She's experience photophobia, nausea, and phonophobia. The Zofran did help with nausea.   She is currently following with the headache clinic and is getting injections every 3 weeks. She thinks this has helped, but has had 8-9 migraines in the past 21 days. Her migraine today is severe.   Review of Systems  Eyes: Positive for photophobia.  Gastrointestinal: Positive for nausea.  Neurological: Positive for headaches.       Past Medical History:  Diagnosis Date  . Chronic tension headaches   . Genital herpes      Social History   Social History  . Marital status: Married    Spouse name: N/A  . Number of children: N/A  . Years of education: N/A   Occupational History  . Not on file.   Social History Main Topics  . Smoking status: Never Smoker  . Smokeless tobacco: Never Used  . Alcohol use No  . Drug use: Unknown  . Sexual activity: Not on file   Other Topics Concern  . Not on file   Social History Narrative   Married.   2 children.   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    Enjoys relaxing and spending time with her family.     Past Surgical History:  Procedure Laterality Date  . BREAST ENHANCEMENT SURGERY      Family History  Problem Relation Age of Onset  . Hyperlipidemia Father     No Known Allergies  Current Outpatient Prescriptions on File  Prior to Visit  Medication Sig Dispense Refill  . amitriptyline (ELAVIL) 10 MG tablet TAKE 1 TABLET BY MOUTH AT BEDTIME FOR  HEADACHE  PREVENTION 30 tablet 0  . ondansetron (ZOFRAN) 4 MG tablet Take 1 tablet (4 mg total) by mouth every 8 (eight) hours as needed for nausea or vomiting. 20 tablet 2  . rizatriptan (MAXALT) 10 MG tablet Take 1 tablet by mouth at migraine onset. May repeat in 2 hours if needed, do not exceed 2 tablets in 24 hours. 10 tablet 0  . valACYclovir (VALTREX) 500 MG tablet TAKE ONE TABLET BY MOUTH EVERY 24 HOURS.  9   No current facility-administered medications on file prior to visit.     BP 114/70   Pulse 77   Temp 98 F (36.7 C) (Oral)   Ht 5\' 1"  (1.549 m)   Wt 127 lb 12.8 oz (58 kg)   SpO2 97%   BMI 24.15 kg/m    Objective:   Physical Exam  Constitutional:  Appears uncomfortable  Eyes: EOM are normal.  Cardiovascular: Normal rate and regular rhythm.   Pulmonary/Chest: Effort normal and breath sounds normal.  Neurological: No cranial nerve deficit.          Assessment & Plan:

## 2016-09-12 NOTE — Addendum Note (Signed)
Addended by: Jacqualin Combes on: 09/12/2016 12:36 PM   Modules accepted: Orders

## 2016-10-05 IMAGING — CT CT HEAD W/O CM
3 series · 16 of 42 positions shown, 19 images · non-contrast
Comparison: None.

CLINICAL DATA: Chronic headaches with nausea and dizziness

EXAM:
CT HEAD WITHOUT CONTRAST
TECHNIQUE: Contiguous axial images were obtained from the base of the skull
through the vertex without intravenous contrast.

[Series 2: head 5.0 h37s · axial · 0.39mm/px · z∈[+117,+217]mm · 10 of 25 slices shown, 13 images]
[im 3/25  brain]
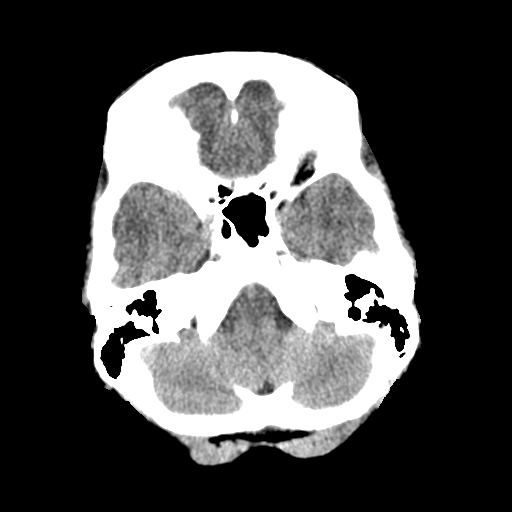
[im 3/25  bone]
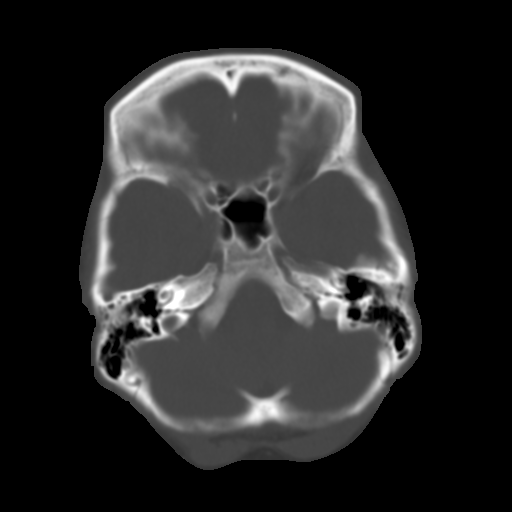
[im 5/25  brain]
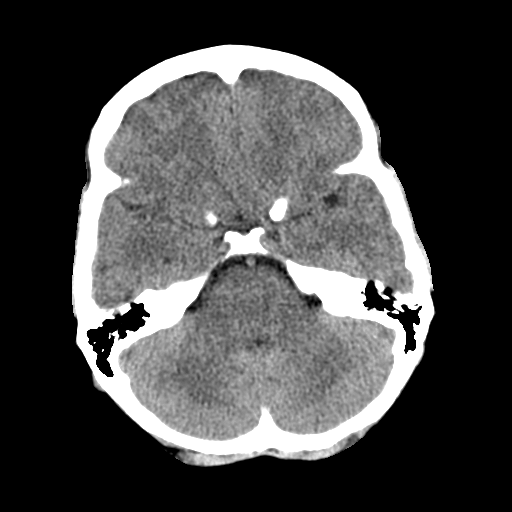
[im 7/25  brain]
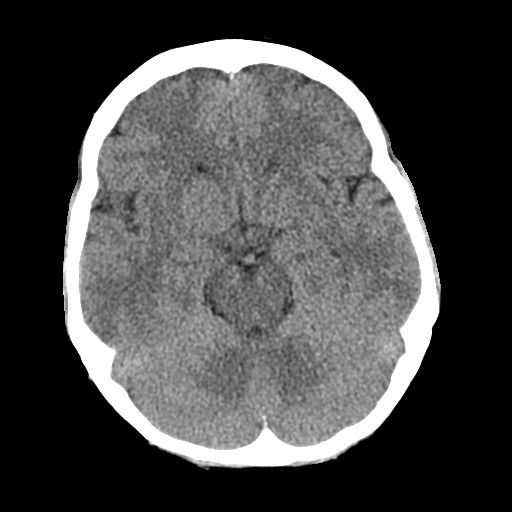
[im 10/25  brain]
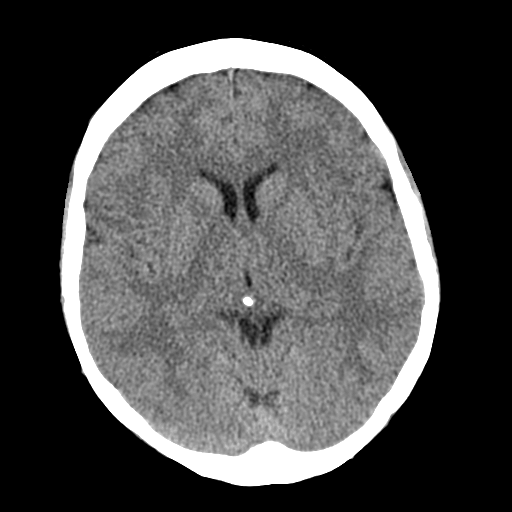
[im 12/25  brain]
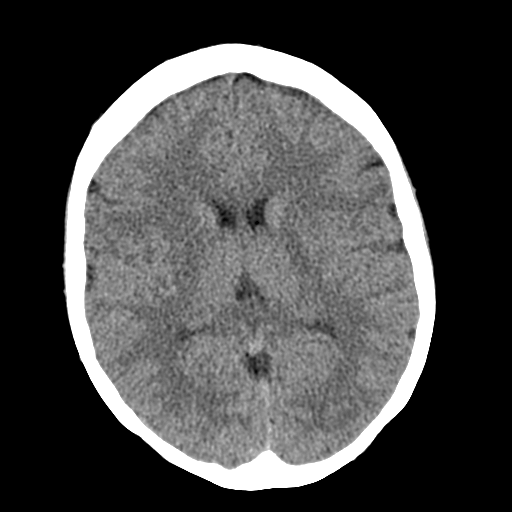
[im 12/25  bone]
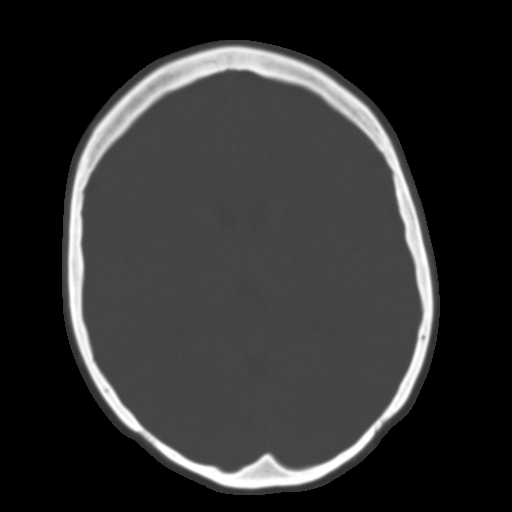
[im 14/25  brain]
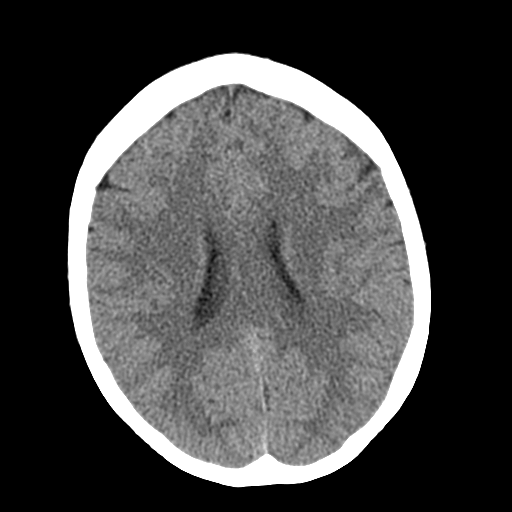
[im 16/25  brain]
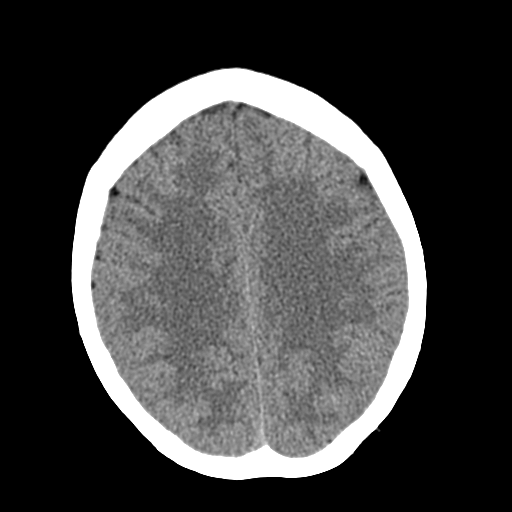
[im 19/25  brain]
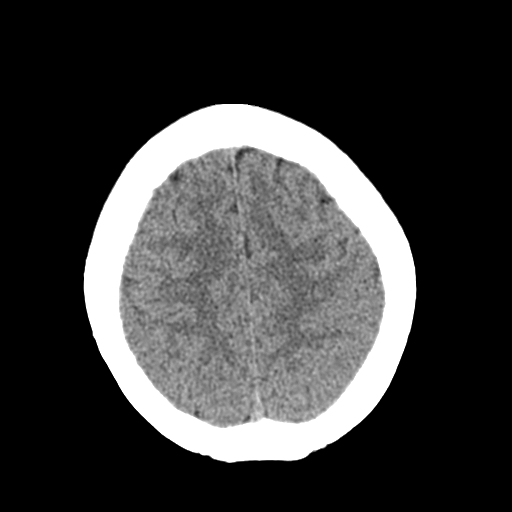
[im 21/25  brain]
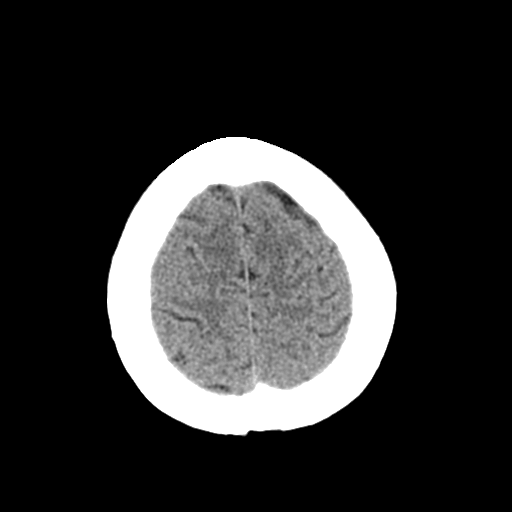
[im 21/25  bone]
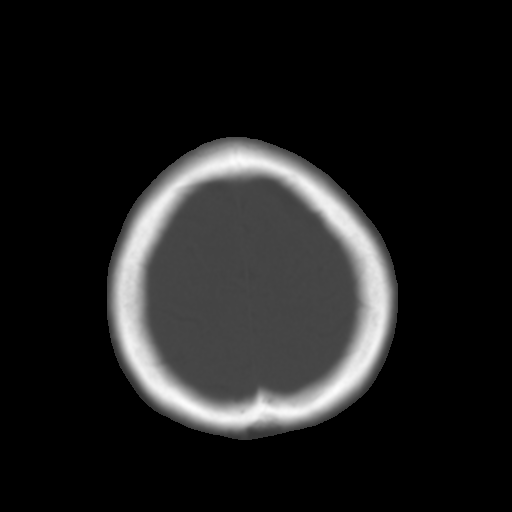
[im 23/25  brain]
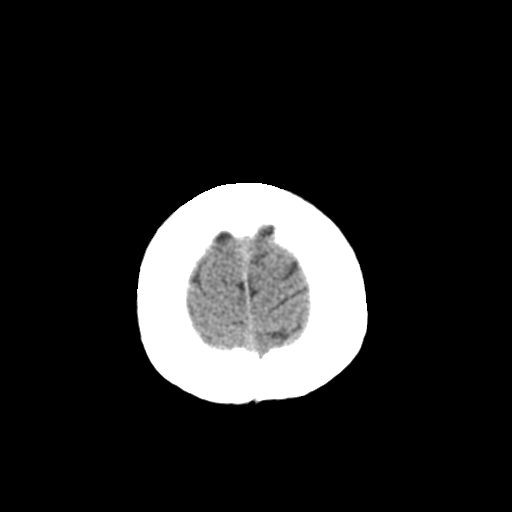

[Series 4: head 3.0 mpr cor · coronal · 0.28mm/px · 3 of 64 slices shown]
[im 22/64  brain]
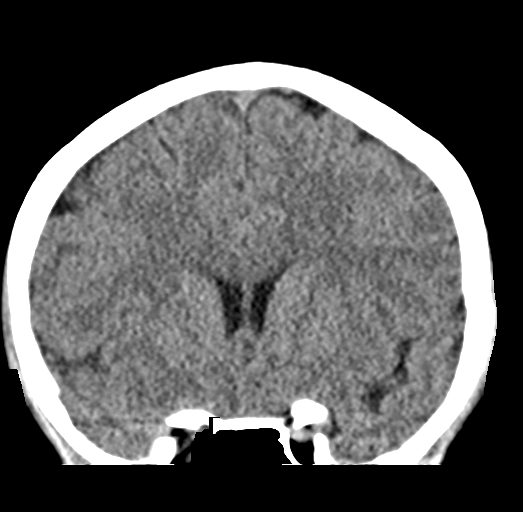
[im 29/64  brain]
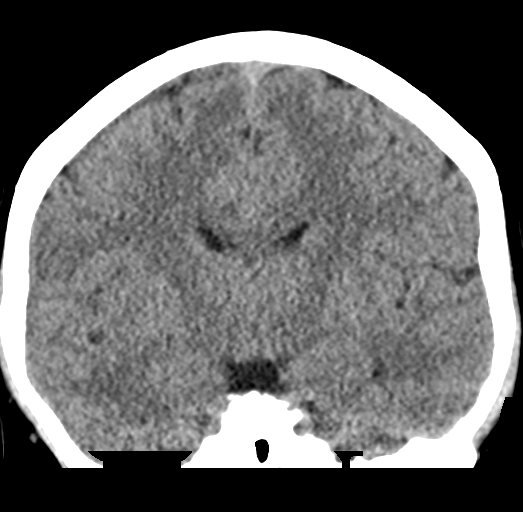
[im 36/64  brain]
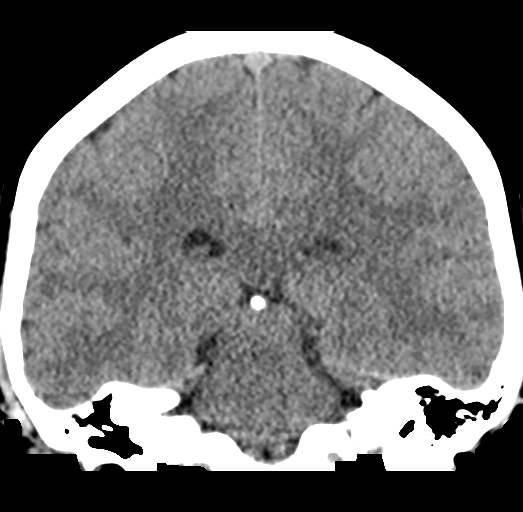

[Series 5: head 3.0 mpr sag · sagittal · 0.26mm/px · 3 of 52 slices shown]
[im 18/52  brain]
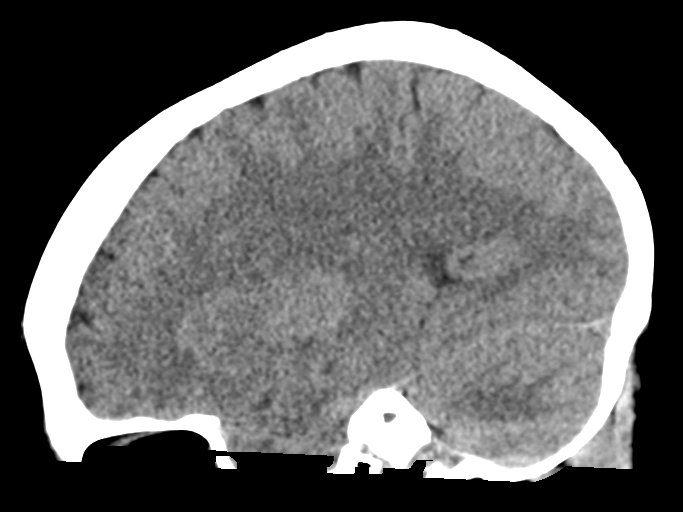
[im 26/52  brain]
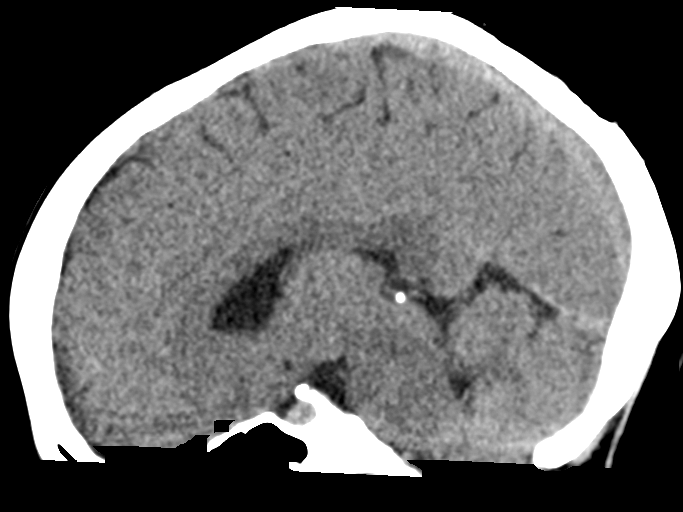
[im 35/52  brain]
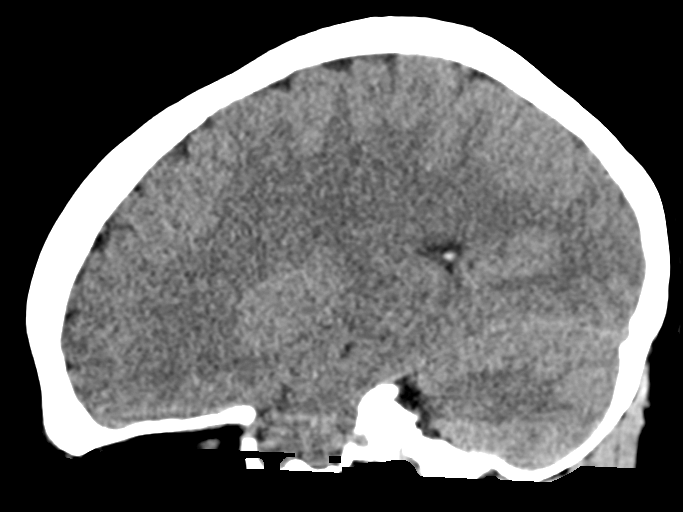

[16 of 42 positions shown; findings below may reference images not displayed]

FINDINGS: Brain: The ventricles are normal in size and configuration. There is
no intracranial mass, hemorrhage, extra-axial fluid collection, or
midline shift. Gray-white compartments are normal. No acute infarct
evident.

Vascular: There is no hyperdense vessel. No vascular calcifications
are evident.

Skull: The bony calvarium appears intact.

Sinuses/Orbits: Visualized orbits appear symmetric bilaterally.
Visualized paranasal sinuses are clear.

Other: Visualized mastoid air cells are clear.
IMPRESSION: Study within normal limits.

## 2016-10-12 ENCOUNTER — Encounter: Payer: Self-pay | Admitting: Primary Care

## 2016-10-12 ENCOUNTER — Ambulatory Visit (INDEPENDENT_AMBULATORY_CARE_PROVIDER_SITE_OTHER): Payer: BLUE CROSS/BLUE SHIELD | Admitting: Primary Care

## 2016-10-12 VITALS — BP 108/72 | HR 70 | Temp 98.0°F | Ht 61.0 in | Wt 126.1 lb

## 2016-10-12 DIAGNOSIS — R51 Headache: Secondary | ICD-10-CM

## 2016-10-12 DIAGNOSIS — R519 Headache, unspecified: Secondary | ICD-10-CM

## 2016-10-12 DIAGNOSIS — G44229 Chronic tension-type headache, not intractable: Secondary | ICD-10-CM

## 2016-10-12 MED ORDER — BACLOFEN 10 MG PO TABS: ORAL_TABLET | ORAL | 0 refills | Status: DC

## 2016-10-12 NOTE — Progress Notes (Signed)
   Subjective:    Patient ID: Danielle Saunders, female    DOB: January 07, 1981, 36 y.o.   MRN: [id number]  HPI  Danielle Saunders is a 36 year old female with a history of chronic migraines and frequent headaches who presents today to discuss headache management.   She was following with the headache clinic and is now managed on baclofen, tizanidine, Zonegran 100 mg daily. She has also been getting injections every two weeks (not sure which type) for the past 2-3 months but this is becoming too expensive.   She's noticed some improvement in headaches overall. Her migraines are less frequent. She's had one migraine since her visit for migraine in late August 2018. She cannot continue to follow up with the headache clinic due to cost.   Review of Systems  Eyes: Negative for photophobia.  Gastrointestinal: Negative for nausea.  Neurological: Negative for headaches.       Past Medical History:  Diagnosis Date  . Chronic tension headaches   . Genital herpes      Social History   Social History  . Marital status: Married    Spouse name: N/A  . Number of children: N/A  . Years of education: N/A   Occupational History  . Not on file.   Social History Main Topics  . Smoking status: Never Smoker  . Smokeless tobacco: Never Used  . Alcohol use No  . Drug use: Unknown  . Sexual activity: Not on file   Other Topics Concern  . Not on file   Social History Narrative   Married.   2 children.   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    Enjoys relaxing and spending time with her family.     Past Surgical History:  Procedure Laterality Date  . BREAST ENHANCEMENT SURGERY      Family History  Problem Relation Age of Onset  . Hyperlipidemia Father     No Known Allergies  Current Outpatient Prescriptions on File Prior to Visit  Medication Sig Dispense Refill  . ondansetron (ZOFRAN) 4 MG tablet Take 1 tablet (4 mg total) by mouth every 8 (eight) hours as needed for nausea or vomiting. 20 tablet 2    . valACYclovir (VALTREX) 500 MG tablet TAKE ONE TABLET BY MOUTH EVERY 24 HOURS.  9   No current facility-administered medications on file prior to visit.     BP 108/72   Pulse 70   Temp 98 F (36.7 C) (Oral)   Ht 5\' 1"  (1.549 m)   Wt 126 lb 1.9 oz (57.2 kg)   SpO2 98%   BMI 23.83 kg/m    Objective:   Physical Exam  Constitutional: She appears well-nourished.  Cardiovascular: Normal rate and regular rhythm.   Pulmonary/Chest: Effort normal and breath sounds normal.  Skin: Skin is warm and dry.          Assessment & Plan:

## 2016-10-12 NOTE — Assessment & Plan Note (Signed)
Overall some improvement on current regimen but cannot afford to continue with frequent follow ups. Will continue her Zonegran daily. She will use the baclofen sparingly as directed for breakthrough headaches. Hold on Tizanidine for now. She will update if headaches become more persistent since she will no longer be doing injections.

## 2016-10-12 NOTE — Patient Instructions (Addendum)
Continue the Zonegran medication daily for headaches.  Use the baclofen as needed for breakthrough headaches/migraines. Do not exceed more than two days in one week.  Hold off on the Tizanidine.  It was a pleasure to see you today!

## 2016-10-25 ENCOUNTER — Encounter: Payer: Self-pay | Admitting: Primary Care

## 2016-11-06 ENCOUNTER — Encounter: Payer: Self-pay | Admitting: Primary Care

## 2016-11-06 ENCOUNTER — Ambulatory Visit: Payer: Self-pay | Admitting: *Deleted

## 2016-11-06 DIAGNOSIS — G43701 Chronic migraine without aura, not intractable, with status migrainosus: Secondary | ICD-10-CM

## 2016-11-06 MED ORDER — ZONISAMIDE 100 MG PO CAPS: 100.0000 mg | ORAL_CAPSULE | Freq: Every day | ORAL | 1 refills | Status: DC

## 2016-11-06 NOTE — Telephone Encounter (Signed)
Pt states she still needs a prescription for Zonasimide. Recently had medications filled and picked up at the pharmacy, but received topamax instead of Zonasimide. Pharmacy states that they did not receive a prescription for Zonasimide. Pt states she needs a refill and is almost out of her current supply.

## 2016-11-06 NOTE — Telephone Encounter (Signed)
Noted, will address via MyChart. 

## 2016-11-06 NOTE — Telephone Encounter (Signed)
It is on pts med list but I don't see where Allie Bossier NP has filled before.Please advise.

## 2016-11-09 MED ORDER — ZONISAMIDE 100 MG PO CAPS: 100.0000 mg | ORAL_CAPSULE | Freq: Every day | ORAL | 1 refills | Status: DC

## 2016-12-28 ENCOUNTER — Ambulatory Visit: Payer: BLUE CROSS/BLUE SHIELD | Admitting: Internal Medicine

## 2017-01-28 ENCOUNTER — Encounter: Payer: Self-pay | Admitting: Primary Care

## 2017-01-28 ENCOUNTER — Ambulatory Visit: Payer: BLUE CROSS/BLUE SHIELD | Admitting: Primary Care

## 2017-01-28 VITALS — BP 102/64 | HR 73 | Temp 97.9°F | Wt 128.0 lb

## 2017-01-28 DIAGNOSIS — G44229 Chronic tension-type headache, not intractable: Secondary | ICD-10-CM | POA: Diagnosis not present

## 2017-01-28 MED ORDER — TIZANIDINE HCL 4 MG PO TABS: ORAL_TABLET | ORAL | 0 refills | Status: DC

## 2017-01-28 MED ORDER — KETOROLAC TROMETHAMINE 60 MG/2ML IM SOLN
60.0000 mg | Freq: Once | INTRAMUSCULAR | Status: AC
  Administered 2017-01-28: 60 mg via INTRAMUSCULAR

## 2017-01-28 NOTE — Patient Instructions (Addendum)
You were provided with an injection of Toradol 60 mg.   Continue on zonisamide 100 mg once daily for migraine prevention. Use the Tizanidine as needed during migraines.  Please notify me if your migraines become more recurrent.  It was a pleasure to see you today!

## 2017-01-28 NOTE — Assessment & Plan Note (Signed)
Compliant to daily prevention treatment. Overall migraines have reduced in frequency. Will have her continue current regimen, but will remove baclofen and resume tizanidine. IM Toradol 60 mg provided today. She will update.

## 2017-01-28 NOTE — Progress Notes (Signed)
   Subjective:    Patient ID: Danielle Saunders, female    DOB: 08/27/1980, 37 y.o.   MRN: [id number]  HPI  Danielle Saunders is a 37 year old female with a history of migraines who presents today with a chief complaint of migraine.   She is currently managed on zonisamide 100 mg once daily for migraine prevention. Her recent migraine started yesterday with nausea and photophobia. Her migraine is located to the left frontal, temporal, and occipital lobe which hasn't improved. She's taken baclofen, ondansetron without improvement. She doesn't feel as though the baclofen has been helpful since it was initially prescribed.   She's had 1-2 migraines her last visit in August 2018 with her last major migraine being in October 2018. She no longer follows with the headache clinic as it was too expensive.   Review of Systems  Eyes: Positive for photophobia.  Gastrointestinal: Positive for nausea.  Neurological: Positive for headaches.       Past Medical History:  Diagnosis Date  . Chronic tension headaches   . Genital herpes      Social History   Socioeconomic History  . Marital status: Married    Spouse name: Not on file  . Number of children: Not on file  . Years of education: Not on file  . Highest education level: Not on file  Social Needs  . Financial resource strain: Not on file  . Food insecurity - worry: Not on file  . Food insecurity - inability: Not on file  . Transportation needs - medical: Not on file  . Transportation needs - non-medical: Not on file  Occupational History  . Not on file  Tobacco Use  . Smoking status: Never Smoker  . Smokeless tobacco: Never Used  Substance and Sexual Activity  . Alcohol use: No    Alcohol/week: 0.0 oz  . Drug use: Not on file  . Sexual activity: Not on file  Other Topics Concern  . Not on file  Social History Narrative   Married.   2 children.   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    Enjoys relaxing and spending time with her family.        Family History  Problem Relation Age of Onset  . Hyperlipidemia Father     No Known Allergies  Current Outpatient Medications on File Prior to Visit  Medication Sig Dispense Refill  . ondansetron (ZOFRAN) 4 MG tablet Take 1 tablet (4 mg total) by mouth every 8 (eight) hours as needed for nausea or vomiting. 20 tablet 2  . valACYclovir (VALTREX) 500 MG tablet TAKE ONE TABLET BY MOUTH EVERY 24 HOURS.  9  . zonisamide (ZONEGRAN) 100 MG capsule Take 1 capsule (100 mg total) by mouth daily. 90 capsule 1   No current facility-administered medications on file prior to visit.     BP 102/64   Pulse 73   Temp 97.9 F (36.6 C) (Oral)   Wt 128 lb (58.1 kg)   BMI 24.19 kg/m    Objective:   Physical Exam  Constitutional: She appears well-nourished.  Eyes: EOM are normal.  Neck: Neck supple.  Cardiovascular: Normal rate and regular rhythm.  Pulmonary/Chest: Effort normal and breath sounds normal.  Neurological: No cranial nerve deficit.  Skin: Skin is warm and dry.          Assessment & Plan:

## 2017-01-28 NOTE — Addendum Note (Signed)
Addended by: Emelia Salisbury C on: 01/28/2017 12:40 PM   Modules accepted: Orders

## 2017-02-05 ENCOUNTER — Other Ambulatory Visit: Payer: Self-pay | Admitting: Primary Care

## 2017-02-05 DIAGNOSIS — G43701 Chronic migraine without aura, not intractable, with status migrainosus: Secondary | ICD-10-CM

## 2017-02-05 MED ORDER — ZONISAMIDE 100 MG PO CAPS: 100.0000 mg | ORAL_CAPSULE | Freq: Every day | ORAL | 1 refills | Status: DC

## 2017-02-05 NOTE — Telephone Encounter (Signed)
Refill sent to pharmacy.   

## 2017-02-05 NOTE — Telephone Encounter (Signed)
Received faxed refill request for zonisamide (ZONEGRAN) 100 MG capsule  Last prescribed on 11/09/2016. Last seen on 01/28/2017

## 2017-02-15 ENCOUNTER — Other Ambulatory Visit: Payer: Self-pay | Admitting: Primary Care

## 2017-02-15 DIAGNOSIS — G44229 Chronic tension-type headache, not intractable: Secondary | ICD-10-CM

## 2017-03-06 ENCOUNTER — Encounter: Payer: Self-pay | Admitting: Primary Care

## 2017-03-14 ENCOUNTER — Encounter: Payer: Self-pay | Admitting: Primary Care

## 2017-03-14 ENCOUNTER — Ambulatory Visit (INDEPENDENT_AMBULATORY_CARE_PROVIDER_SITE_OTHER)
Admission: RE | Admit: 2017-03-14 | Discharge: 2017-03-14 | Disposition: A | Discharge status: Home / Self Care | Payer: BLUE CROSS/BLUE SHIELD | Source: Ambulatory Visit | Attending: Primary Care | Admitting: Primary Care

## 2017-03-14 ENCOUNTER — Ambulatory Visit: Payer: BLUE CROSS/BLUE SHIELD | Admitting: Primary Care

## 2017-03-14 VITALS — BP 118/82 | HR 69 | Temp 98.0°F | Ht 61.0 in | Wt 127.8 lb

## 2017-03-14 DIAGNOSIS — R110 Nausea: Secondary | ICD-10-CM

## 2017-03-14 DIAGNOSIS — R1012 Left upper quadrant pain: Secondary | ICD-10-CM

## 2017-03-14 DIAGNOSIS — G44229 Chronic tension-type headache, not intractable: Secondary | ICD-10-CM | POA: Diagnosis not present

## 2017-03-14 LAB — CBC
HEMATOCRIT: 43.3 % (ref 36.0–46.0)
Hemoglobin: 14.4 g/dL (ref 12.0–15.0)
MCHC: 33.3 g/dL (ref 30.0–36.0)
MCV: 87.7 fl (ref 78.0–100.0)
Platelets: 277 10*3/uL (ref 150.0–400.0)
RBC: 4.94 Mil/uL (ref 3.87–5.11)
RDW: 13.9 % (ref 11.5–15.5)
WBC: 6.1 10*3/uL (ref 4.0–10.5)

## 2017-03-14 LAB — H. PYLORI ANTIBODY, IGG: H Pylori IgG: NEGATIVE

## 2017-03-14 MED ORDER — ONDANSETRON HCL 4 MG PO TABS: 4.0000 mg | ORAL_TABLET | Freq: Three times a day (TID) | ORAL | 0 refills | Status: DC | PRN

## 2017-03-14 NOTE — Assessment & Plan Note (Signed)
Chronic for years, worse recently.  Could be ulcer from chronic NSAID use/recurrent stress. Check labs today including CBC, H Pylori. She is not on a PPI. Check abdominal xray to rule out any residual constipation. Consider PPI once labs and imaging return.

## 2017-03-14 NOTE — Progress Notes (Signed)
   Subjective:    Patient ID: Danielle Saunders, female    DOB: Nov 15, 1980, 37 y.o.   MRN: [id number]  HPI  [name] is a 37 year old female with a history of chronic migraines who presents today with a chief complaint of abdominal pain.  Her pain is located to the left upper quadrant that has been present intermittently for the last several years. Her pain returned several weeks ago, pain was worse than usual, then improved for a few days, then returned several days ago. She describes her pain as a "pulling/burning sensation". She denies diarrhea, increased constipation, increased nausea. She does experience intermittent constipation from her migraine medications.   She was once taking Ibuprofen regularly for a years, stopped about 6 months ago after establishing with the headache clinic. She doesn't notice if eating or drinking makes symptoms worse. She does notice her pain is worse sometimes when bending forward. She's not taken anything OTC for treatment.  Review of Systems  Constitutional: Negative for fever and unexpected weight change.  Gastrointestinal: Positive for abdominal pain and constipation. Negative for blood in stool, diarrhea, nausea and vomiting.       Past Medical History:  Diagnosis Date  . Chronic tension headaches   . Genital herpes      Social History   Socioeconomic History  . Marital status: Married    Spouse name: Not on file  . Number of children: Not on file  . Years of education: Not on file  . Highest education level: Not on file  Social Needs  . Financial resource strain: Not on file  . Food insecurity - worry: Not on file  . Food insecurity - inability: Not on file  . Transportation needs - medical: Not on file  . Transportation needs - non-medical: Not on file  Occupational History  . Not on file  Tobacco Use  . Smoking status: Never Smoker  . Smokeless tobacco: Never Used  Substance and Sexual Activity  . Alcohol use: No    Alcohol/week: 0.0  oz  . Drug use: Not on file  . Sexual activity: Not on file  Other Topics Concern  . Not on file  Social History Narrative   Married.   2 children.   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    Enjoys relaxing and spending time with her family.       Family History  Problem Relation Age of Onset  . Hyperlipidemia Father     No Known Allergies  Current Outpatient Medications on File Prior to Visit  Medication Sig Dispense Refill  . tiZANidine (ZANAFLEX) 4 MG tablet Take 1 tablet by mouth twice daily as needed for migraines. Limit 1-2 treatments per week 30 tablet 0  . valACYclovir (VALTREX) 500 MG tablet TAKE ONE TABLET BY MOUTH EVERY 24 HOURS.  9  . zonisamide (ZONEGRAN) 100 MG capsule Take 1 capsule (100 mg total) by mouth daily. 90 capsule 1   No current facility-administered medications on file prior to visit.        Objective:   Physical Exam  Constitutional: She appears well-nourished.  Neck: Neck supple.  Cardiovascular: Normal rate.  Pulmonary/Chest: Effort normal.  Abdominal: Soft. Bowel sounds are normal. There is no tenderness.  Skin: Skin is warm and dry.          Assessment & Plan:

## 2017-03-14 NOTE — Patient Instructions (Addendum)
Stop by the lab and xray prior to leaving today. I will notify you of your results once received.   Refrain from taking Ibuprofen, Advil, Motrin, naproxen, Aleve.   I'll be in touch soon!  It was a pleasure to see you today!

## 2017-03-17 ENCOUNTER — Telehealth: Payer: Self-pay | Admitting: Family Medicine

## 2017-05-30 ENCOUNTER — Encounter: Payer: Self-pay | Admitting: Family Medicine

## 2017-05-30 ENCOUNTER — Ambulatory Visit: Payer: BLUE CROSS/BLUE SHIELD | Admitting: Family Medicine

## 2017-05-30 DIAGNOSIS — G43001 Migraine without aura, not intractable, with status migrainosus: Secondary | ICD-10-CM

## 2017-05-30 DIAGNOSIS — R110 Nausea: Secondary | ICD-10-CM

## 2017-05-30 DIAGNOSIS — G43909 Migraine, unspecified, not intractable, without status migrainosus: Secondary | ICD-10-CM | POA: Insufficient documentation

## 2017-05-30 MED ORDER — ONDANSETRON HCL 4 MG PO TABS: 4.0000 mg | ORAL_TABLET | Freq: Three times a day (TID) | ORAL | 0 refills | Status: DC | PRN

## 2017-05-30 MED ORDER — KETOROLAC TROMETHAMINE 30 MG/ML IJ SOLN
60.0000 mg | Freq: Once | INTRAMUSCULAR | Status: AC
  Administered 2017-05-30: 60 mg via INTRAMUSCULAR

## 2017-05-30 MED ORDER — PROMETHAZINE HCL 25 MG/ML IJ SOLN
25.0000 mg | Freq: Once | INTRAMUSCULAR | Status: AC
  Administered 2017-05-30: 25 mg via INTRAMUSCULAR

## 2017-05-30 NOTE — Patient Instructions (Signed)
For migraine Drink fluids toradol injection and phenergan injection now  Go home and rest   I sent in zofran  Use your tizanidine as needed  Cold compress on head and neck   Alert [place] if no improvement  If suddenly worse-go to the ER

## 2017-05-30 NOTE — Assessment & Plan Note (Signed)
First one in a while / reassuring exam  toradol 60 mg IM Phenergan 25 mg IM  Warned of sedation  Rest/cold compress/fluids  Refilled zofran for prn use  Has tizanidine if needed (again warn of sedation)  Update if not starting to improve in the next day or if worsening

## 2017-05-30 NOTE — Progress Notes (Signed)
Subjective:    Patient ID: Danielle Saunders, female    DOB: Jul 27, 1980, 37 y.o.   MRN: [id number]  HPI 37 yo pt of NP Clark here for migraine  Has not had a bad migraine in a while  No specific trigger   R sided  Rad down neck  Throbbing and worse with exertion and light and sound  This headache started last night -got worse this am   Took zofran at 6:30 am  Nausea -no vomiting   Drank some water   Has not taken zanaflex - it makes her groggy   Has been to the headache clinic in the past  Had trigger point injections Has changed her diet   Wt Readings from Last 3 Encounters:  05/30/17 129 lb 8 oz (58.7 kg)  03/14/17 127 lb 12.8 oz (58 kg)  01/28/17 128 lb (58.1 kg)   BP Readings from Last 3 Encounters:  05/30/17 102/66  03/14/17 118/82  01/28/17 102/64     Review of Systems  Constitutional: Negative for activity change, appetite change, fatigue, fever and unexpected weight change.  HENT: Negative for congestion, ear pain, rhinorrhea, sinus pressure and sore throat.   Eyes: Negative for pain, redness and visual disturbance.  Respiratory: Negative for cough, shortness of breath and wheezing.   Cardiovascular: Negative for chest pain and palpitations.  Gastrointestinal: Positive for nausea. Negative for abdominal pain, blood in stool, constipation, diarrhea and vomiting.  Endocrine: Negative for polydipsia and polyuria.  Genitourinary: Negative for dysuria, frequency and urgency.  Musculoskeletal: Negative for arthralgias, back pain and myalgias.  Skin: Negative for pallor and rash.  Allergic/Immunologic: Negative for environmental allergies.  Neurological: Positive for headaches. Negative for dizziness and syncope.  Hematological: Negative for adenopathy. Does not bruise/bleed easily.  Psychiatric/Behavioral: Negative for decreased concentration and dysphoric mood. The patient is not nervous/anxious.        Objective:   Physical Exam  Constitutional: She is  oriented to person, place, and time. She appears well-developed and well-nourished. No distress.  Fatigued appearing  Wearing sunglasses   HENT:  Head: Normocephalic and atraumatic.  Nose: Nose normal.  Mouth/Throat: Oropharynx is clear and moist. No oropharyngeal exudate.  No sinus tenderness No temporal tenderness  No TMJ tenderness  Eyes: Pupils are equal, round, and reactive to light. Conjunctivae and EOM are normal. Right eye exhibits no discharge. Left eye exhibits no discharge. No scleral icterus.  No nystagmus  Neck: Normal range of motion and full passive range of motion without pain. Neck supple. No JVD present. Carotid bruit is not present. No tracheal deviation present. No thyromegaly present.  Cardiovascular: Normal rate, regular rhythm and normal heart sounds.  No murmur heard. Pulmonary/Chest: Effort normal and breath sounds normal. No respiratory distress. She has no wheezes. She has no rales.  Musculoskeletal: She exhibits no edema or tenderness.  Lymphadenopathy:    She has no cervical adenopathy.  Neurological: She is alert and oriented to person, place, and time. She has normal strength and normal reflexes. She displays no atrophy, no tremor and normal reflexes. No cranial nerve deficit. She displays a negative Romberg sign. Coordination and gait normal.  No focal cerebellar signs   Nl gait   Skin: Skin is warm and dry. No rash noted. No pallor.  Psychiatric: She has a normal mood and affect. Her behavior is normal. Thought content normal.          Assessment & Plan:   Problem List Items Addressed This Visit  Cardiovascular and Mediastinum   Migraine    First one in a while / reassuring exam  toradol 60 mg IM Phenergan 25 mg IM  Warned of sedation  Rest/cold compress/fluids  Refilled zofran for prn use  Has tizanidine if needed (again warn of sedation)  Update if not starting to improve in the next day or if worsening         Other Visit  Diagnoses    Nausea without vomiting       Relevant Medications   ondansetron (ZOFRAN) 4 MG tablet

## 2017-06-13 DIAGNOSIS — Z01419 Encounter for gynecological examination (general) (routine) without abnormal findings: Secondary | ICD-10-CM | POA: Diagnosis not present

## 2017-06-19 ENCOUNTER — Ambulatory Visit: Payer: BLUE CROSS/BLUE SHIELD | Admitting: Primary Care

## 2017-06-19 ENCOUNTER — Encounter: Payer: Self-pay | Admitting: Primary Care

## 2017-06-19 VITALS — BP 110/66 | HR 82 | Temp 98.0°F | Ht 61.0 in | Wt 124.0 lb

## 2017-06-19 DIAGNOSIS — J029 Acute pharyngitis, unspecified: Secondary | ICD-10-CM | POA: Diagnosis not present

## 2017-06-19 DIAGNOSIS — J069 Acute upper respiratory infection, unspecified: Secondary | ICD-10-CM

## 2017-06-19 LAB — POCT RAPID STREP A (OFFICE): RAPID STREP A SCREEN: NEGATIVE

## 2017-06-19 NOTE — Addendum Note (Signed)
Addended by: Jacqualin Combes on: 06/19/2017 10:16 AM   Modules accepted: Orders

## 2017-06-19 NOTE — Patient Instructions (Addendum)
Your strep test was negative.  Continue Mucinex if this is helping.   You can also try Delsym as needed for cough, an antihistamine (Zyrtec, Allegra, Claritin) to help dry up your drainage.  Please call me if you develop fevers of 101 or higher, severe redness to the throat, white spots on the throat.  Make sure to drink plenty of water.  It was a pleasure to see you today!

## 2017-06-19 NOTE — Progress Notes (Signed)
Subjective:    Patient ID: Danielle Saunders, female    DOB: Sep 03, 1980, 36 y.o.   MRN: [id number]  HPI  Danielle Saunders is a 37 year old female with a history of migraines who presents today with a chief complaint of cough.  She also reports hoarse voice, nasal congestion, dry cough, post nasal drip. Her symptoms began three days ago. Her sore throat began yesterday. Her daughter was diagnosed with strep throat yesterday, is under treatment with antibiotics. She denies fevers. She's taken Mucinex with some improvement.   Review of Systems  Constitutional: Negative for fever.  HENT: Positive for congestion and sore throat. Negative for ear pain and sinus pressure.   Respiratory: Positive for cough. Negative for shortness of breath.        Past Medical History:  Diagnosis Date  . Chronic tension headaches   . Genital herpes      Social History   Socioeconomic History  . Marital status: Married    Spouse name: Not on file  . Number of children: Not on file  . Years of education: Not on file  . Highest education level: Not on file  Occupational History  . Not on file  Social Needs  . Financial resource strain: Not on file  . Food insecurity:    Worry: Not on file    Inability: Not on file  . Transportation needs:    Medical: Not on file    Non-medical: Not on file  Tobacco Use  . Smoking status: Never Smoker  . Smokeless tobacco: Never Used  Substance and Sexual Activity  . Alcohol use: No    Alcohol/week: 0.0 oz  . Drug use: Not on file  . Sexual activity: Not on file  Lifestyle  . Physical activity:    Days per week: Not on file    Minutes per session: Not on file  . Stress: Not on file  Relationships  . Social connections:    Talks on phone: Not on file    Gets together: Not on file    Attends religious service: Not on file    Active member of club or organization: Not on file    Attends meetings of clubs or organizations: Not on file    Relationship status: Not  on file  . Intimate partner violence:    Fear of current or ex partner: Not on file    Emotionally abused: Not on file    Physically abused: Not on file    Forced sexual activity: Not on file  Other Topics Concern  . Not on file  Social History Narrative   Married.   2 children.   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    Enjoys relaxing and spending time with her family.     Past Surgical History:  Procedure Laterality Date  . BREAST ENHANCEMENT SURGERY      Family History  Problem Relation Age of Onset  . Hyperlipidemia Father     No Known Allergies  Current Outpatient Medications on File Prior to Visit  Medication Sig Dispense Refill  . ondansetron (ZOFRAN) 4 MG tablet Take 1 tablet (4 mg total) by mouth every 8 (eight) hours as needed for nausea or vomiting. (Patient not taking: Reported on 06/19/2017) 20 tablet 0  . tiZANidine (ZANAFLEX) 4 MG tablet Take 1 tablet by mouth twice daily as needed for migraines. Limit 1-2 treatments per week (Patient not taking: Reported on 06/19/2017) 30 tablet 0  . valACYclovir (VALTREX) 500  MG tablet TAKE ONE TABLET BY MOUTH EVERY 24 HOURS.  9  . zonisamide (ZONEGRAN) 100 MG capsule Take 1 capsule (100 mg total) by mouth daily. (Patient not taking: Reported on 06/19/2017) 90 capsule 1   No current facility-administered medications on file prior to visit.     BP 110/66   Pulse 82   Temp 98 F (36.7 C) (Oral)   Ht 5\' 1"  (1.549 m)   Wt 124 lb (56.2 kg)   SpO2 97%   BMI 23.43 kg/m    Objective:   Physical Exam  Constitutional: She appears well-nourished. She does not appear ill.  HENT:  Right Ear: Tympanic membrane and ear canal normal.  Left Ear: Tympanic membrane and ear canal normal.  Nose: No mucosal edema. Right sinus exhibits no maxillary sinus tenderness and no frontal sinus tenderness. Left sinus exhibits no maxillary sinus tenderness and no frontal sinus tenderness.  Mouth/Throat: Oropharynx is clear and moist.  Hoarse voice  Neck:  Neck supple.  Cardiovascular: Normal rate and regular rhythm.  Respiratory: Effort normal and breath sounds normal. She has no wheezes.  Skin: Skin is warm and dry.           Assessment & Plan:  URI:  Cough, sore throat, congestion x 3 days. Exposed to strep from daughter. Exam today without evidence of strep pharyngitis or bacterial cause. Rapid Strep: Negative Suspect viral vs allergy involvement. Discussed use of antihistamine, Delsym PRN cough, Mucinex PRN. Fluids, rest, return precautions provided.  Pleas Koch, NP

## 2017-06-24 ENCOUNTER — Telehealth: Payer: Self-pay

## 2017-06-24 NOTE — Telephone Encounter (Signed)
PLEASE NOTE: All timestamps contained within this report are represented as [place] Standard Time. CONFIDENTIALTY NOTICE: This fax transmission is intended only for the addressee. It contains information that is legally privileged, confidential or otherwise protected from use or disclosure. If you are not the intended recipient, you are strictly prohibited from reviewing, disclosing, copying using or disseminating any of this information or taking any action in reliance on or regarding this information. If you have received this fax in error, please notify us immediately by telephone so that we can arrange for its return to [place]. Phone: [phone], Toll-Free: [phone], Fax: [phone] Page: 1 of 2 Call Id: [id number] [place] Patient Name: Danielle Saunders Gender: Female DOB: 1980-07-19 Age: 37 Y 9 M 30 D Return Phone Number: [phone] (Primary), [phone] (Secondary) Address[place] [address] Client  [facility] - Client Client Site [place] Physician Alma Friendly - NP Contact Type Call Who Is Calling Patient / Member / Family / Caregiver Call Type Triage / Clinical Caller Name Laiklyn Pilkenton Relationship To Patient Spouse Return Phone Number [phone] (Secondary) Chief Complaint Headache Reason for Call Symptomatic / Request for [place] states his wife has been having a migraine since last night. She normally gets an injection for it. Translation No Nurse Assessment Nurse: Arthor Captain, RN, Margaret Date/Time (Eastern Time): 06/22/2017 7:59:31 AM Confirm and document reason for call. If symptomatic, describe symptoms. ---Caller states that wife has had a migraine since last night. Headache pain is a 9/10 on pain scale. Does the patient have any new or worsening symptoms?  ---Yes Will a triage be completed? ---Yes Related visit to physician within the last 2 weeks? ---Yes Does the PT have any chronic conditions? (i.e. diabetes, asthma, etc.) ---No Is the patient pregnant or possibly pregnant? (Ask all females between the ages of 54-55) ---No Is this a behavioral health or substance abuse call? ---No Guidelines Guideline Title Affirmed Question Affirmed Notes Nurse Date/Time (Eastern Time) Headache [1] SEVERE headache (e.g., excruciating) AND [2] "worst headache" of life Cockrum, RN, Joycelyn Schmid 06/22/2017 8:01:27 AM Disp. Time Eilene Ghazi Time) Disposition Final User 06/22/2017 7:31:00 AM Attempt made - message left Cockrum, RN, Joycelyn Schmid 06/22/2017 7:32:05 AM Attempt made - message left Cockrum, RN, Joycelyn Schmid 06/22/2017 7:48:30 AM Attempt made - message left Cockrum, RN, Joycelyn Schmid 06/22/2017 8:04:54 AM Go to ED Now (or PCP triage) Yes Cockrum, RN, Joycelyn Schmid PLEASE NOTE: All timestamps contained within this report are represented as [place] Standard Time. CONFIDENTIALTY NOTICE: This fax transmission is intended only for the addressee. It contains information that is legally privileged, confidential or otherwise protected from use or disclosure. If you are not the intended recipient, you are strictly prohibited from reviewing, disclosing, copying using or disseminating any of this information or taking any action in reliance on or regarding this information. If you have received this fax in error, please notify us immediately by telephone so that we can arrange for its return to [place]. Phone: [phone], Toll-Free: [phone], Fax: [phone] Page: 2 of 2 Call Id: [id number] [place] Disagree/Comply Comply Caller Understands Yes PreDisposition Did not know what to do Care Advice Given Per Guideline GO TO ED NOW (OR PCP TRIAGE): * IF NO PCP TRIAGE: You need to be seen. Go to the [facility] at _[facility] within the next hour. Leave as soon as you can. PAIN  MEDICINES: * For pain relief,  take acetaminophen, ibuprofen, or naproxen. DRIVING: Another adult should drive. CARE ADVICE given per Headache (Adult) guideline. Referrals [facility]

## 2017-06-24 NOTE — Telephone Encounter (Signed)
I spoke with pt and she did not go anywhere to be seen over weekend; migraine was better yesterday and pt declined appt for today. Dion Saucier NP.

## 2017-06-24 NOTE — Telephone Encounter (Signed)
Noted  

## 2017-07-11 ENCOUNTER — Ambulatory Visit: Payer: Self-pay | Admitting: Primary Care

## 2017-07-11 NOTE — Telephone Encounter (Signed)
Yesterday pt was felt a sting or a bite ( pt was not sure what it was) to her right little finger. Yesterday her finger was stinging and so she washed her hands and rubbed the area with med for stinging.  Today her finger is red and itching and throbbing and with mild itching. The edema is double the size of her other little finger.  PCP not available and attempted to reach [facility]. Used a same day appt for 2 pm Friday with Webb Silversmith FNP. Reason for Disposition . [1] Red or very tender (to touch) area AND [2] started over 24 hours after the bite  Answer Assessment - Initial Assessment Questions 1. TYPE of INSECT: "What type of insect was it?"      Pt is unsure 2. ONSET: "When did you get bitten?"      yesterday 3. LOCATION: "Where is the insect bite located?"      Right little finger 4. REDNESS: "Is the area red or pink?" If so, ask "What size is area of redness?" (inches or cm). "When did the redness start?"     Red- palm side on the little finger is red 5. PAIN: "Is there any pain?" If so, ask: "How bad is it?"  (Scale 1-10; or mild, moderate, severe)     throbbing 6. ITCHING: "Does it itch?" If so, ask: "How bad is the itch?"    - MILD: doesn't interfere with normal activities   - MODERATE-SEVERE: interferes with work, school, sleep, or other activities      Mild- cannot write 7. SWELLING: "How big is the swelling?" (inches, cm, or compare to coins)     Edema on the little finger- double the size of the other finger 8. OTHER SYMPTOMS: "Do you have any other symptoms?"  (e.g., difficulty breathing, hives)     no 9. PREGNANCY: "Is there any chance you are pregnant?" "When was your last menstrual period?"     No- Has IUD no menses  Protocols used: INSECT BITE-A-AH

## 2017-07-12 ENCOUNTER — Ambulatory Visit: Payer: BLUE CROSS/BLUE SHIELD

## 2017-07-12 ENCOUNTER — Encounter: Payer: Self-pay | Admitting: Internal Medicine

## 2017-07-12 VITALS — BP 106/74 | HR 64 | Temp 98.1°F | Wt 129.0 lb

## 2017-07-12 DIAGNOSIS — G43C0 Periodic headache syndromes in child or adult, not intractable: Secondary | ICD-10-CM

## 2017-07-12 DIAGNOSIS — S60466A Insect bite (nonvenomous) of right little finger, initial encounter: Secondary | ICD-10-CM

## 2017-07-12 DIAGNOSIS — W57XXXA Bitten or stung by nonvenomous insect and other nonvenomous arthropods, initial encounter: Secondary | ICD-10-CM | POA: Diagnosis not present

## 2017-07-12 MED ORDER — KETOROLAC TROMETHAMINE 30 MG/ML IJ SOLN: 30.0000 mg | Freq: Once | INTRAMUSCULAR | Status: AC

## 2017-07-12 NOTE — Patient Instructions (Signed)
Insect Bite, Adult An insect bite can make your skin red, itchy, and swollen. Some insects can spread disease to people with a bite. However, most insect bites do not lead to disease, and most are not serious. Follow these instructions at home: Bite area care  Do not scratch the bite area.  Keep the bite area clean and dry.  Wash the bite area every day with soap and water as told by your doctor.  Check the bite area every day for signs of infection. Check for: ? More redness, swelling, or pain. ? Fluid or blood. ? Warmth. ? Pus. Managing pain, itching, and swelling  You may put any of these on the bite area as told by your doctor: ? A baking soda paste. ? Cortisone cream. ? Calamine lotion.  If directed, put ice on the bite area. ? Put ice in a plastic bag. ? Place a towel between your skin and the bag. ? Leave the ice on for 20 minutes, 2-3 times a day. Medicines  Take medicines or put medicines on your skin only as told by your doctor.  If you were prescribed an antibiotic medicine, use it as told by your doctor. Do not stop using the antibiotic even if your condition improves. General instructions  Keep all follow-up visits as told by your doctor. This is important. How is this prevented? To help you have a lower risk of insect bites:  When you are outside, wear clothing that covers your arms and legs.  Use insect repellent. The best insect repellents have: ? An active ingredient of DEET, picaridin, oil of lemon eucalyptus (OLE), or IR3535. ? Higher amounts of DEET or another active ingredient than other repellents have.  If your home windows do not have screens, think about putting some in.  Contact a doctor if:  You have more redness, swelling, or pain in the bite area.  You have fluid, blood, or pus coming from the bite area.  The bite area feels warm.  You have a fever. Get help right away if:  You have joint pain.  You have a rash.  You have  shortness of breath.  You feel more tired or sleepy than you normally do.  You have neck pain.  You have a headache.  You feel weaker than you normally do.  You have chest pain.  You have pain in your belly.  You feel sick to your stomach (nauseous) or you throw up (vomit). Summary  An insect bite can make your skin red, itchy, and swollen.  Do not scratch the bite area, and keep it clean and dry.  Ice can help with pain and itching from the bite. This information is not intended to replace advice given to you by your health care provider. Make sure you discuss any questions you have with your health care provider. Document Released: 12/30/1999 Document Revised: 08/04/2015 Document Reviewed: 05/19/2014 Elsevier Interactive Patient Education  2018 Elsevier Inc.  

## 2017-07-12 NOTE — Progress Notes (Signed)
Subjective:    Patient ID: Danielle Saunders, female    DOB: 27-Mar-1980, 37 y.o.   MRN: [id number]  HPI  Pt presents to the clinic today with c/o an insect bite to the tip of her right pinky finger. She reports this occurred 2 days ago. She was outside, when she felt something sting her. She was not able to see what it was. She reports immediate swelling, redness and pain. She has tried Benadryl, Ibuprofen and ice with great improvement in symptoms.  She also reports right sided headache. This started 2-3 days ago. She describes the headache as throbbing. The pain radiates into her neck. She reports nausea, sensitivity to light and sound. She denies dizziness, visual changes, vomiting. She has taken Zanaflex with minimal relief. She is requesting Toradol injection today. She reports she has Phenergan at home to take for nausea.  Review of Systems  Past Medical History:  Diagnosis Date  . Chronic tension headaches   . Genital herpes     Current Outpatient Medications  Medication Sig Dispense Refill  . ondansetron (ZOFRAN) 4 MG tablet Take 1 tablet (4 mg total) by mouth every 8 (eight) hours as needed for nausea or vomiting. 20 tablet 0  . tiZANidine (ZANAFLEX) 4 MG tablet Take 1 tablet by mouth twice daily as needed for migraines. Limit 1-2 treatments per week 30 tablet 0  . valACYclovir (VALTREX) 500 MG tablet TAKE ONE TABLET BY MOUTH EVERY 24 HOURS.  9  . zonisamide (ZONEGRAN) 100 MG capsule Take 1 capsule (100 mg total) by mouth daily. 90 capsule 1   No current facility-administered medications for this visit.     No Known Allergies  Family History  Problem Relation Age of Onset  . Hyperlipidemia Father     Social History   Socioeconomic History  . Marital status: Married    Spouse name: Not on file  . Number of children: Not on file  . Years of education: Not on file  . Highest education level: Not on file  Occupational History  . Not on file  Social Needs  . Financial  resource strain: Not on file  . Food insecurity:    Worry: Not on file    Inability: Not on file  . Transportation needs:    Medical: Not on file    Non-medical: Not on file  Tobacco Use  . Smoking status: Never Smoker  . Smokeless tobacco: Never Used  Substance and Sexual Activity  . Alcohol use: No    Alcohol/week: 0.0 oz  . Drug use: Not on file  . Sexual activity: Not on file  Lifestyle  . Physical activity:    Days per week: Not on file    Minutes per session: Not on file  . Stress: Not on file  Relationships  . Social connections:    Talks on phone: Not on file    Gets together: Not on file    Attends religious service: Not on file    Active member of club or organization: Not on file    Attends meetings of clubs or organizations: Not on file    Relationship status: Not on file  . Intimate partner violence:    Fear of current or ex partner: Not on file    Emotionally abused: Not on file    Physically abused: Not on file    Forced sexual activity: Not on file  Other Topics Concern  . Not on file  Social History Narrative  Married.   2 children.   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    Enjoys relaxing and spending time with her family.      Constitutional: Pt reports headache. Denies fever, malaise, fatigue, or abrupt weight changes.  HEENT: Denies eye pain, eye redness, ear pain, ringing in the ears, wax buildup, runny nose, nasal congestion, bloody nose, or sore throat. Respiratory: Denies difficulty breathing, shortness of breath, cough or sputum production.   Cardiovascular: Denies chest pain, chest tightness, palpitations or swelling in the hands or feet.  Gastrointestinal: Pt reports nausea. Denies abdominal pain, bloating, constipation, diarrhea or blood in the stool.  Musculoskeletal: Pt reports redness and swelling of right pinky finger. Denies difficulty with gait, muscle pain.  Skin: Pt reports redness to right pinky finger. Denies rashes, lesions or  ulcercations.  Neurological: Denies dizziness, difficulty with memory, difficulty with speech or problems with balance and coordination.    No other specific complaints in a complete review of systems (except as listed in HPI above).     Objective:   Physical Exam   BP 106/74   Pulse 64   Temp 98.1 F (36.7 C) (Oral)   Wt 129 lb (58.5 kg)   BMI 24.37 kg/m  Wt Readings from Last 3 Encounters:  07/12/17 129 lb (58.5 kg)  06/19/17 124 lb (56.2 kg)  05/30/17 129 lb 8 oz (58.7 kg)    General: Appears her stated age, well developed, well nourished in NAD. Skin: Redness and swelling noted from tip of pinky finger down to DIP. No concern for cellulitis. HEENT: Head: normal shape and size; Eyes: PERRLA and EOMs intact, no nystagmus; Abdomen: Soft and nontender. Normal bowel sounds.  Musculoskeletal: Decreased flexion of the right pinky finger. Normal extension. Neurological: Alert and oriented. Coordination normal.    BMET    Component Value Date/Time   NA 141 02/18/2015 1042   K 4.0 02/18/2015 1042   CL 109 02/18/2015 1042   CO2 25 02/18/2015 1042   GLUCOSE 89 02/18/2015 1042   BUN 14 02/18/2015 1042   CREATININE 0.94 02/18/2015 1042   CALCIUM 9.3 02/18/2015 1042    Lipid Panel     Component Value Date/Time   CHOL 188 02/18/2015 1042   TRIG 58.0 02/18/2015 1042   HDL 58.40 02/18/2015 1042   CHOLHDL 3 02/18/2015 1042   VLDL 11.6 02/18/2015 1042   LDLCALC 118 (H) 02/18/2015 1042    CBC    Component Value Date/Time   WBC 6.1 03/14/2017 1044   RBC 4.94 03/14/2017 1044   HGB 14.4 03/14/2017 1044   HCT 43.3 03/14/2017 1044   PLT 277.0 03/14/2017 1044   MCV 87.7 03/14/2017 1044   MCH 30.0 09/21/2009 0530   MCHC 33.3 03/14/2017 1044   RDW 13.9 03/14/2017 1044   LYMPHSABS 2.3 01/25/2009 1933   MONOABS 0.4 01/25/2009 1933   EOSABS 0.3 01/25/2009 1933   BASOSABS 0.1 01/25/2009 1933    Hgb A1C Lab Results  Component Value Date   HGBA1C 5.1 02/18/2015            Assessment & Plan:   Migraine:  30 mg Toradol IM today Continue Zonegran Continue Zaneflex every 8 hours as needed Take Phenergan every 8 hours as needed for nausea  Insect Bite of Finger:  Local reaction only 30 mg Toradol IM today Continue Bendaryl, ice as needed for swelling  Return precautions discussed Webb Silversmith, NP

## 2017-09-04 NOTE — Telephone Encounter (Signed)
error 

## 2017-10-24 ENCOUNTER — Other Ambulatory Visit: Payer: Self-pay | Admitting: Primary Care

## 2017-10-24 DIAGNOSIS — G43701 Chronic migraine without aura, not intractable, with status migrainosus: Secondary | ICD-10-CM

## 2017-11-02 DIAGNOSIS — G43909 Migraine, unspecified, not intractable, without status migrainosus: Secondary | ICD-10-CM | POA: Diagnosis not present

## 2017-11-04 ENCOUNTER — Telehealth: Payer: Self-pay

## 2017-11-04 NOTE — Telephone Encounter (Signed)
Noted and reviewed. 

## 2017-11-04 NOTE — Telephone Encounter (Signed)
TH portal is not working; [facility] faxed a note to [facility]; copy in Gentry Fitz NP in box. Pt went to [facility] after hours and got tramadol and phenergan injection; pt is feeling better today and has scheduled appt with Gentry Fitz NP 11/07/17 at 10:20 to discuss h/a med.

## 2017-11-07 ENCOUNTER — Encounter: Payer: Self-pay | Admitting: Primary Care

## 2017-11-07 ENCOUNTER — Ambulatory Visit: Payer: BLUE CROSS/BLUE SHIELD | Admitting: Primary Care

## 2017-11-07 DIAGNOSIS — G43C0 Periodic headache syndromes in child or adult, not intractable: Secondary | ICD-10-CM

## 2017-11-07 DIAGNOSIS — G44229 Chronic tension-type headache, not intractable: Secondary | ICD-10-CM | POA: Diagnosis not present

## 2017-11-07 MED ORDER — PROMETHAZINE HCL 12.5 MG PO TABS: 12.5000 mg | ORAL_TABLET | Freq: Three times a day (TID) | ORAL | 0 refills | Status: DC | PRN

## 2017-11-07 MED ORDER — TIZANIDINE HCL 4 MG PO TABS: ORAL_TABLET | ORAL | 0 refills | Status: DC

## 2017-11-07 MED ORDER — AMITRIPTYLINE HCL 25 MG PO TABS: 25.0000 mg | ORAL_TABLET | Freq: Every day | ORAL | 0 refills | Status: DC

## 2017-11-07 MED ORDER — KETOROLAC TROMETHAMINE 60 MG/2ML IM SOLN
60.0000 mg | Freq: Once | INTRAMUSCULAR | Status: AC
  Administered 2017-11-07: 60 mg via INTRAMUSCULAR

## 2017-11-07 NOTE — Patient Instructions (Signed)
Start weaning off of [place]. Take 1 capsule every other day until you run out.  Start amitriptyline 25 mg tablets nightly for migraine prevention.    We've switched you from ondansetron (Zofran) to promethazine (Phenergan) for nausea/vomiting. You may take 1-2 tablets every 8 hours as needed. Caution as this may cause drowsiness.  You may try taking 2 tablets of the tizanidine during migraines if needed.  You will be contacted regarding your referral to Neurology.  Please let us know if you have not been contacted within one week.   It was a pleasure to see you today!

## 2017-11-07 NOTE — Addendum Note (Signed)
Addended by: Jacqualin Combes on: 11/07/2017 11:39 AM   Modules accepted: Orders

## 2017-11-07 NOTE — Assessment & Plan Note (Signed)
Chronic, uncontrolled. Failed numerous Rx treatments. Was following with the headache clinic but this was helping.   Long discussion regarding treatment and next steps. Will start Amitriptyline 25 mg daily. Switch to promethazine form ondansetron. Increase Tizanidine to 8 mg PRN. Referral placed to Neurology for further evaluation, she may do well on injectable medications.

## 2017-11-07 NOTE — Progress Notes (Signed)
Subjective:    Patient ID: Danielle Saunders, female    DOB: November 06, 1980, 37 y.o.   MRN: [id number]  HPI  Danielle Saunders is a 37 year old female who presents today for Urgent Care Follow up.  She presented to [facility] on 10/19 for migraine symptoms. Her migraine was located to the entire head which began that morning. She experienced nausea with vomiting, photophobia, phonophobia. She was provided with injections of phenergan and Toradol at [facility] with improvement.   She is currently managed on Zonegran 100 mg daily, tizanidine and ondansetron PRN. Since her Urgent Care visit she's doing better but started to notice headaches intermittently on Monday and today. She's been trialed on Topamax and Amitriptyline, also Imitrex,  Maxalt, baclofen in the past. She didn't like the way she felt on Topamax, but isn't sure why she was taken off of Amitriptyline.   She doesn't think the Tizanidine or Zofran are helping as she didn't find much relief with the last two migraines.    Review of Systems  Eyes: Negative for photophobia and visual disturbance.  Neurological: Positive for headaches.       Past Medical History:  Diagnosis Date  . Chronic tension headaches   . Genital herpes      Social History   Socioeconomic History  . Marital status: Married    Spouse name: Not on file  . Number of children: Not on file  . Years of education: Not on file  . Highest education level: Not on file  Occupational History  . Not on file  Social Needs  . Financial resource strain: Not on file  . Food insecurity:    Worry: Not on file    Inability: Not on file  . Transportation needs:    Medical: Not on file    Non-medical: Not on file  Tobacco Use  . Smoking status: Never Smoker  . Smokeless tobacco: Never Used  Substance and Sexual Activity  . Alcohol use: No    Alcohol/week: 0.0 standard drinks  . Drug use: Not on file  . Sexual activity: Not on file  Lifestyle  . Physical activity:   Days per week: Not on file    Minutes per session: Not on file  . Stress: Not on file  Relationships  . Social connections:    Talks on phone: Not on file    Gets together: Not on file    Attends religious service: Not on file    Active member of club or organization: Not on file    Attends meetings of clubs or organizations: Not on file    Relationship status: Not on file  . Intimate partner violence:    Fear of current or ex partner: Not on file    Emotionally abused: Not on file    Physically abused: Not on file    Forced sexual activity: Not on file  Other Topics Concern  . Not on file  Social History Narrative   Married.   2 children.   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    Enjoys relaxing and spending time with her family.     Past Surgical History:  Procedure Laterality Date  . BREAST ENHANCEMENT SURGERY      Family History  Problem Relation Age of Onset  . Hyperlipidemia Father     No Known Allergies  Current Outpatient Medications on File Prior to Visit  Medication Sig Dispense Refill  . ondansetron (ZOFRAN) 4 MG tablet Take 1  tablet (4 mg total) by mouth every 8 (eight) hours as needed for nausea or vomiting. 20 tablet 0  . tiZANidine (ZANAFLEX) 4 MG tablet Take 1 tablet by mouth twice daily as needed for migraines. Limit 1-2 treatments per week 30 tablet 0  . valACYclovir (VALTREX) 500 MG tablet TAKE ONE TABLET BY MOUTH EVERY 24 HOURS.  9  . zonisamide (ZONEGRAN) 100 MG capsule TAKE 1 CAPSULE BY MOUTH EVERY DAY 90 capsule 1   No current facility-administered medications on file prior to visit.     BP 110/70   Pulse 90   Temp 97.6 F (36.4 C) (Oral)   Ht 5\' 1"  (1.549 m)   Wt 134 lb 12 oz (61.1 kg)   SpO2 98%   BMI 25.46 kg/m    Objective:   Physical Exam  Constitutional: She is oriented to person, place, and time. She appears well-nourished.  Eyes: EOM are normal.  Neck: Neck supple.  Cardiovascular: Normal rate and regular rhythm.  Respiratory:  Effort normal and breath sounds normal.  Neurological: She is alert and oriented to person, place, and time. No cranial nerve deficit.  Skin: Skin is warm and dry.  Psychiatric: She has a normal mood and affect.           Assessment & Plan:

## 2017-11-12 ENCOUNTER — Ambulatory Visit: Payer: BLUE CROSS/BLUE SHIELD | Admitting: Primary Care

## 2017-11-26 ENCOUNTER — Telehealth: Payer: Self-pay

## 2017-11-26 NOTE — Telephone Encounter (Signed)
Left message for patient to follow up on referral

## 2017-12-09 NOTE — Telephone Encounter (Signed)
Just wanted to send as an FYI. Closing referral for now. Thank you

## 2017-12-31 ENCOUNTER — Other Ambulatory Visit: Payer: Self-pay | Admitting: Primary Care

## 2017-12-31 DIAGNOSIS — G44229 Chronic tension-type headache, not intractable: Secondary | ICD-10-CM

## 2017-12-31 NOTE — Telephone Encounter (Signed)
Sent patient message via mychart.

## 2017-12-31 NOTE — Telephone Encounter (Signed)
Last prescribed and seen on 11/07/2017

## 2017-12-31 NOTE — Telephone Encounter (Signed)
Refill sent to pharmacy.   

## 2018-03-08 IMAGING — DX DG ABDOMEN 2V
2 series · 2 of 2 positions shown · non-contrast
Comparison: CT abdomen pelvis 11/18/2003

CLINICAL DATA: Left upper abdomen pain for several years, worse
recently, history of chronic constipation

EXAM:
ABDOMEN - 2 VIEW

[abdomen erect]
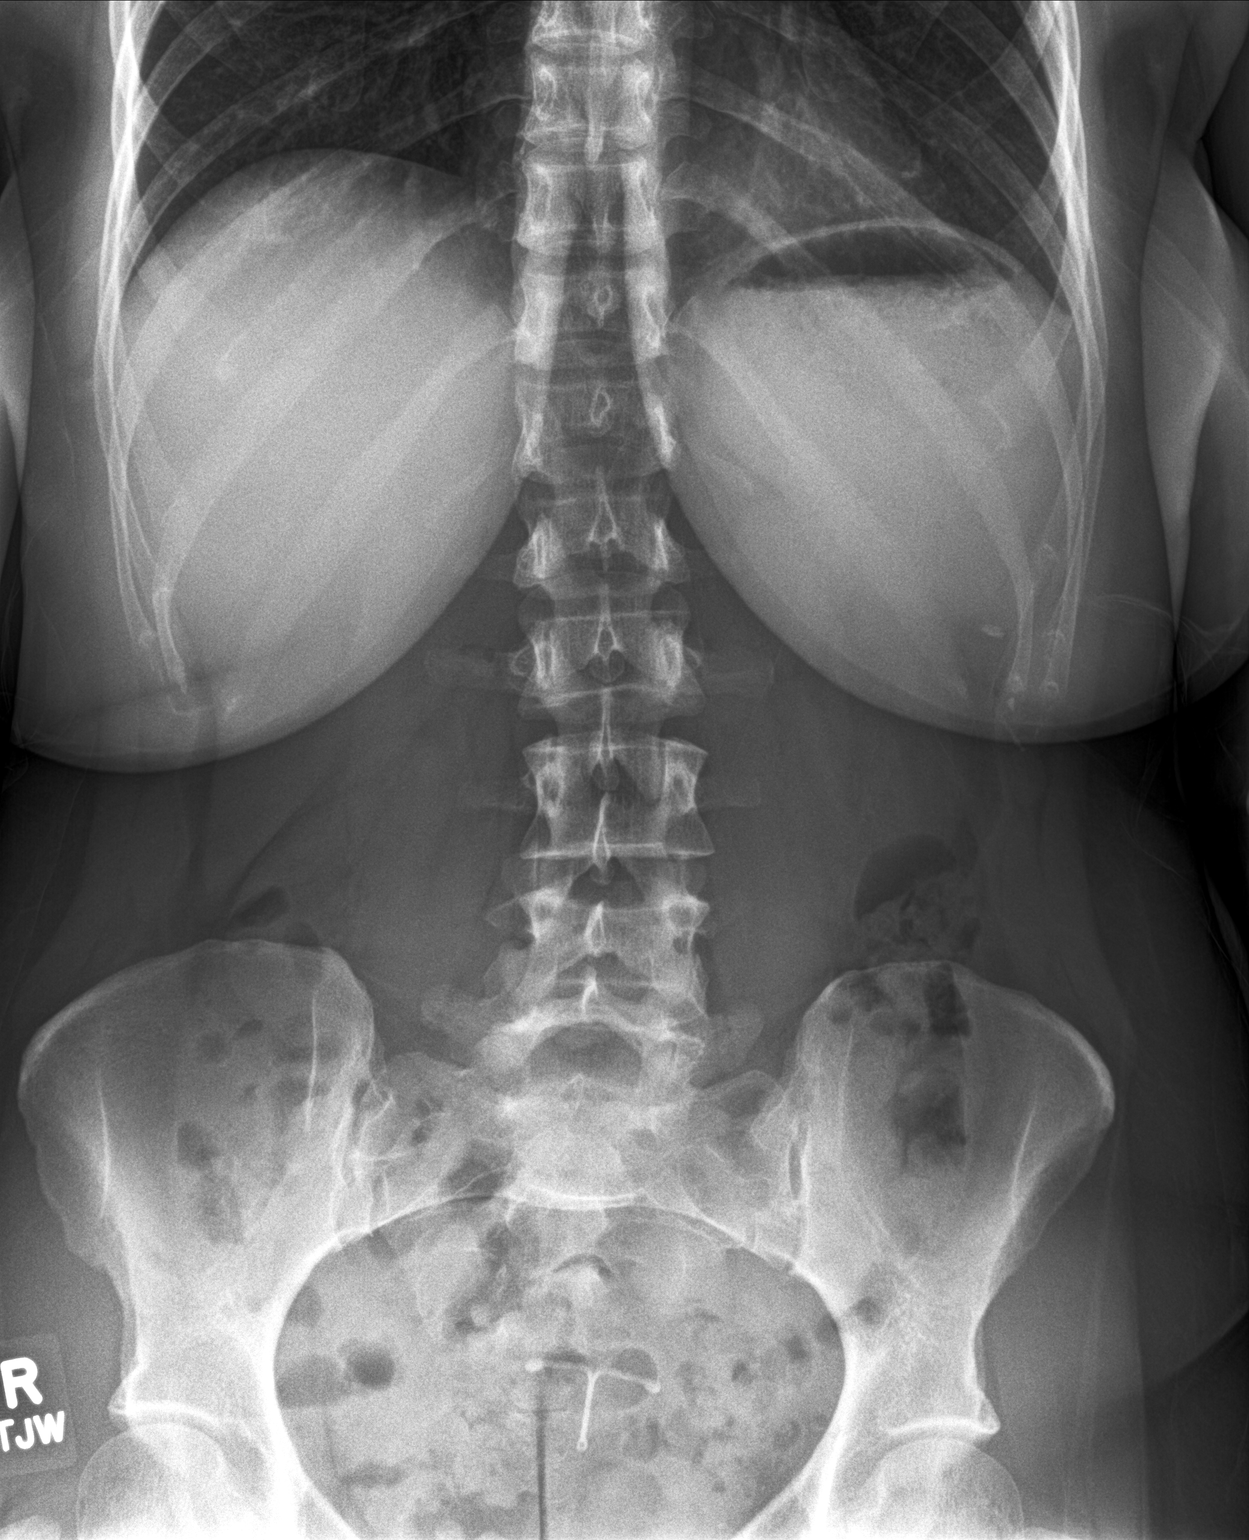

[abdomen supine]
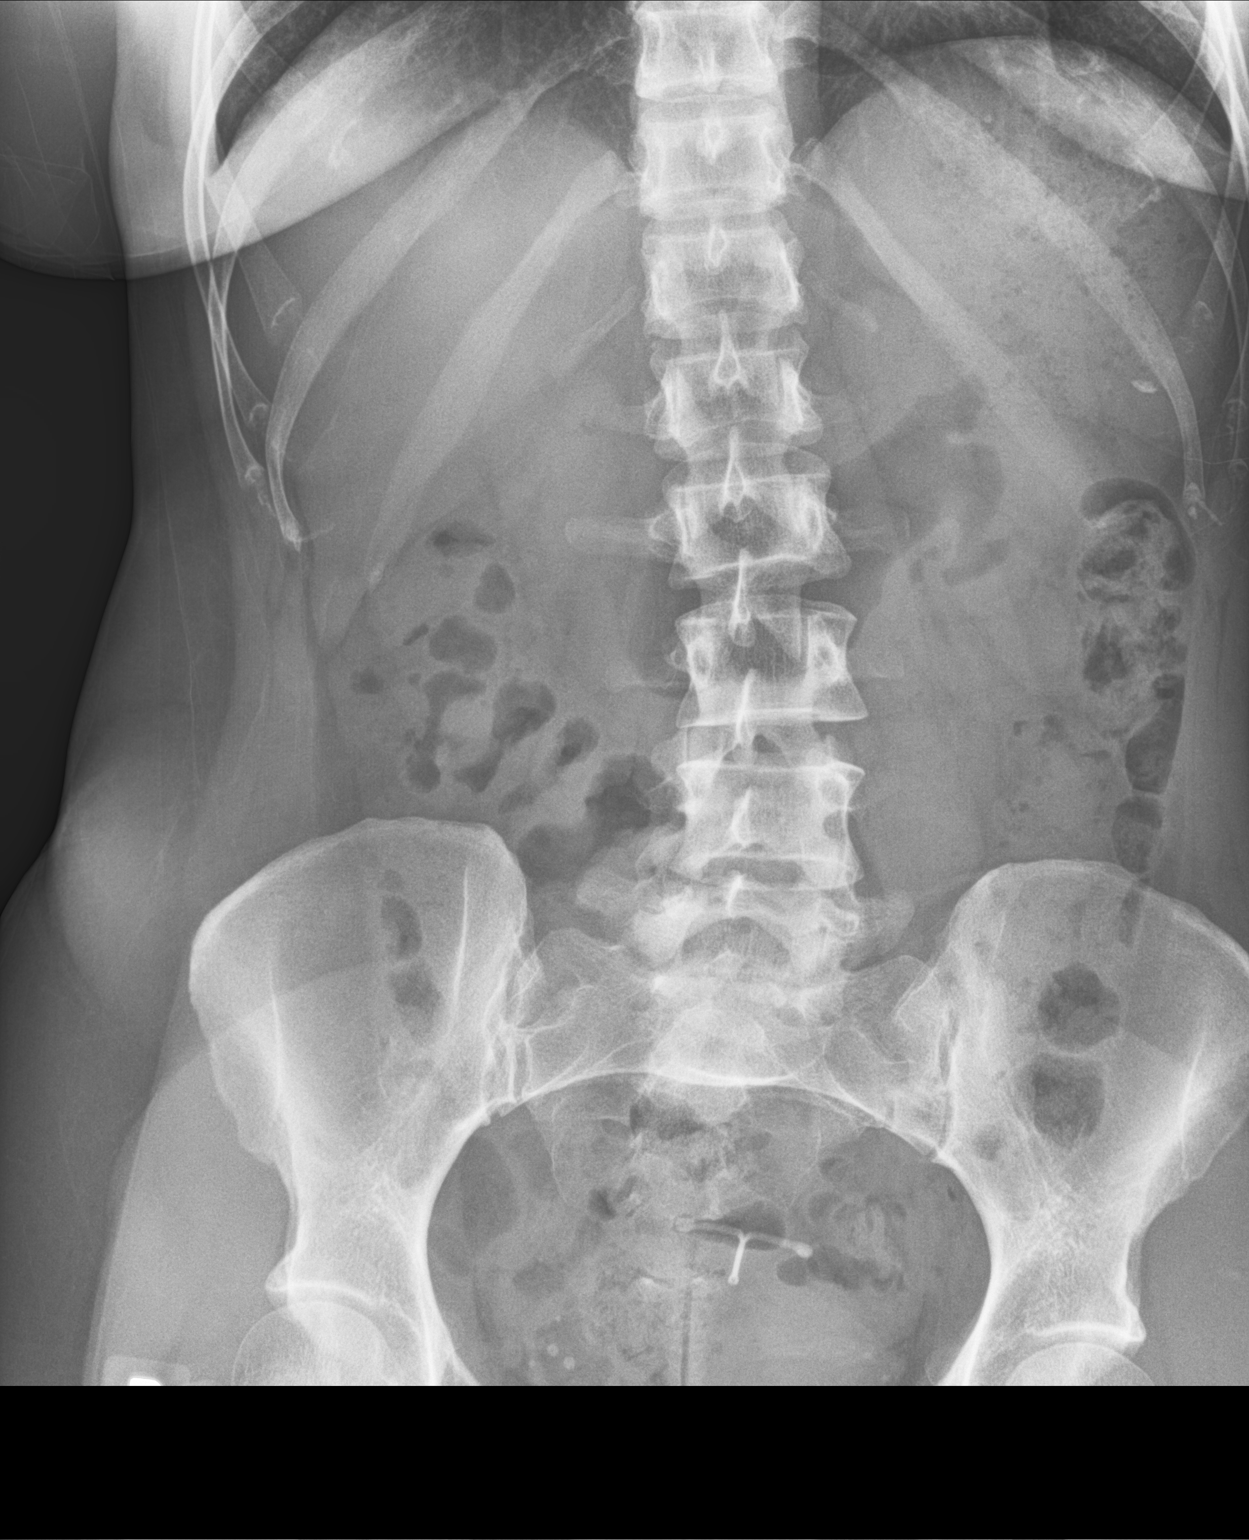

[2 of 2 positions shown; findings below may reference images not displayed]

FINDINGS: Supine and erect views of the abdomen show no bowel obstruction. No
free air is seen. No radiographic evidence of constipation is noted.
No opaque calculi are seen. IUD is noted in the mid pelvis. The
bones are unremarkable.
IMPRESSION: 1. No bowel obstruction.  No free air.
2. No radiographic evidence of constipation is seen.
3. IUD within the mid pelvis.

## 2018-04-27 ENCOUNTER — Other Ambulatory Visit: Payer: Self-pay | Admitting: Primary Care

## 2018-04-27 DIAGNOSIS — G44229 Chronic tension-type headache, not intractable: Secondary | ICD-10-CM

## 2018-06-06 DIAGNOSIS — G43909 Migraine, unspecified, not intractable, without status migrainosus: Secondary | ICD-10-CM | POA: Diagnosis not present

## 2018-07-23 ENCOUNTER — Other Ambulatory Visit: Payer: Self-pay | Admitting: Primary Care

## 2018-07-23 DIAGNOSIS — G44229 Chronic tension-type headache, not intractable: Secondary | ICD-10-CM

## 2018-10-08 ENCOUNTER — Other Ambulatory Visit: Payer: Self-pay | Admitting: Emergency Medicine

## 2018-10-08 DIAGNOSIS — Z20822 Contact with and (suspected) exposure to covid-19: Secondary | ICD-10-CM

## 2018-10-08 DIAGNOSIS — R6889 Other general symptoms and signs: Secondary | ICD-10-CM | POA: Diagnosis not present

## 2018-10-10 LAB — NOVEL CORONAVIRUS, NAA: SARS-CoV-2, NAA: DETECTED — AB

## 2018-10-21 ENCOUNTER — Other Ambulatory Visit: Payer: Self-pay

## 2018-10-21 DIAGNOSIS — Z20822 Contact with and (suspected) exposure to covid-19: Secondary | ICD-10-CM

## 2018-10-21 DIAGNOSIS — Z20828 Contact with and (suspected) exposure to other viral communicable diseases: Secondary | ICD-10-CM | POA: Diagnosis not present

## 2018-10-24 LAB — NOVEL CORONAVIRUS, NAA: SARS-CoV-2, NAA: NOT DETECTED

## 2018-10-26 ENCOUNTER — Other Ambulatory Visit: Payer: Self-pay | Admitting: Primary Care

## 2018-10-26 DIAGNOSIS — G44229 Chronic tension-type headache, not intractable: Secondary | ICD-10-CM

## 2018-10-31 ENCOUNTER — Other Ambulatory Visit: Payer: Self-pay

## 2018-10-31 ENCOUNTER — Ambulatory Visit (INDEPENDENT_AMBULATORY_CARE_PROVIDER_SITE_OTHER): Payer: BC Managed Care – PPO | Admitting: Primary Care

## 2018-10-31 ENCOUNTER — Encounter: Payer: Self-pay | Admitting: Primary Care

## 2018-10-31 DIAGNOSIS — G44229 Chronic tension-type headache, not intractable: Secondary | ICD-10-CM

## 2018-10-31 DIAGNOSIS — G43C0 Periodic headache syndromes in child or adult, not intractable: Secondary | ICD-10-CM

## 2018-10-31 MED ORDER — AMITRIPTYLINE HCL 50 MG PO TABS: 50.0000 mg | ORAL_TABLET | Freq: Every day | ORAL | 0 refills | Status: DC

## 2018-10-31 NOTE — Assessment & Plan Note (Signed)
Migraines and headaches becoming more frequent. Doing well on amitriptyline overall.  Increase amitriptyline to 50 mg HS.  Continue PRN use of Toradol, Tizanidine, Promethazine. Discussed to use Toradol sparingly.  She will update in 4 weeks.

## 2018-10-31 NOTE — Patient Instructions (Signed)
We've increased the dose of your amitriptyline to 50 mg for migraine prevention.  Continue to use the tizanidine and promethazine as needed.  Please update me if your migraines persist/progress.  It was a pleasure to see you today! Allie Bossier, NP-C

## 2018-10-31 NOTE — Assessment & Plan Note (Signed)
Migraines and headaches becoming more frequent. Doing well on amitriptyline overall.  Increase amitriptyline to 50 mg HS.  Continue PRN use of Toradol, Tizanidine, Promethazine. Discussed to use Toradol sparingly.  She will update in 4 weeks

## 2018-10-31 NOTE — Progress Notes (Signed)
Subjective:    Patient ID: Danielle Saunders, female    DOB: Aug 08, 1980, 38 y.o.   MRN: [id number]  HPI  Virtual Visit via Video Note  I connected with Danielle Saunders on 10/31/18 at  9:00 AM EDT by a video enabled telemedicine application and verified that I am speaking with the correct person using two identifiers.  Location: Patient: Home Provider: Office   I discussed the limitations of evaluation and management by telemedicine and the availability of in person appointments. The patient expressed understanding and agreed to proceed.  History of Present Illness:  Ms. Lemelin is a 38 year old female who presents today for follow up.  1) Chronic headaches/migraines: Currently managed on amitriptyline 25 mg HS, tizanidine PRN, promethazine 12.5 mg PRN.  Overall she's done well on amitriptyline 25 mg, has had to go to Urgent care twice for abortive treatment over the last one year. She does have a prescription for Toradol 10 mg for which she got from Urgent Care, she has taken 9 of those tablets since May 2020.   She is using Tizanidine first for migraine onset, then will proceed to Toradol if no improvement. She is using promethazine during migraines. Over the last several months she's noticed increase in frequency of migraines which are occurring 1-2 times monthly.    2) Genital Herpes: Currently managed on valacyclovir 500 mg for which she takes PRN. She follows with her GYN for this. No frequent breakouts.    Observations/Objective:  Alert and oriented. Appears well, not sickly. No distress. Speaking in complete sentences.   Assessment and Plan:  See problem based charting  Follow Up Instructions:  We've increased the dose of your amitriptyline to 50 mg for migraine prevention.  Continue to use the tizanidine and promethazine as needed.  Please update me if your migraines persist/progress.  It was a pleasure to see you today! Allie Bossier, NP-C    I discussed the  assessment and treatment plan with the patient. The patient was provided an opportunity to ask questions and all were answered. The patient agreed with the plan and demonstrated an understanding of the instructions.   The patient was advised to call back or seek an in-person evaluation if the symptoms worsen or if the condition fails to improve as anticipated.    Pleas Koch, NP    Review of Systems  Constitutional: Negative for fever.  Eyes: Positive for photophobia.  Gastrointestinal: Positive for nausea.  Neurological: Positive for headaches. Negative for dizziness.       Past Medical History:  Diagnosis Date  . Chronic tension headaches   . Genital herpes      Social History   Socioeconomic History  . Marital status: Married    Spouse name: Not on file  . Number of children: Not on file  . Years of education: Not on file  . Highest education level: Not on file  Occupational History  . Not on file  Social Needs  . Financial resource strain: Not on file  . Food insecurity    Worry: Not on file    Inability: Not on file  . Transportation needs    Medical: Not on file    Non-medical: Not on file  Tobacco Use  . Smoking status: Never Smoker  . Smokeless tobacco: Never Used  Substance and Sexual Activity  . Alcohol use: No    Alcohol/week: 0.0 standard drinks  . Drug use: Not on file  . Sexual activity:  Not on file  Lifestyle  . Physical activity    Days per week: Not on file    Minutes per session: Not on file  . Stress: Not on file  Relationships  . Social Herbalist on phone: Not on file    Gets together: Not on file    Attends religious service: Not on file    Active member of club or organization: Not on file    Attends meetings of clubs or organizations: Not on file    Relationship status: Not on file  . Intimate partner violence    Fear of current or ex partner: Not on file    Emotionally abused: Not on file    Physically abused:  Not on file    Forced sexual activity: Not on file  Other Topics Concern  . Not on file  Social History Narrative   Married.   2 children.   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    Enjoys relaxing and spending time with her family.     Past Surgical History:  Procedure Laterality Date  . BREAST ENHANCEMENT SURGERY      Family History  Problem Relation Age of Onset  . Hyperlipidemia Father     No Known Allergies  Current Outpatient Medications on File Prior to Visit  Medication Sig Dispense Refill  . amitriptyline (ELAVIL) 25 MG tablet TAKE 1 TABLET (25 MG TOTAL) BY MOUTH AT BEDTIME. FOR MIGRAINE PREVENTION. 90 tablet 0  . promethazine (PHENERGAN) 12.5 MG tablet Take 1-2 tablets (12.5-25 mg total) by mouth every 8 (eight) hours as needed for nausea or vomiting. 30 tablet 0  . tiZANidine (ZANAFLEX) 4 MG tablet Take 1-2 tablets by mouth twice daily as needed for migraines. Limit 1-2 treatments per week 30 tablet 0  . valACYclovir (VALTREX) 500 MG tablet TAKE ONE TABLET BY MOUTH EVERY 24 HOURS.  9   No current facility-administered medications on file prior to visit.     Wt 166 lb (75.3 kg)   BMI 31.37 kg/m    Objective:   Physical Exam  Constitutional: She is oriented to person, place, and time. She appears well-nourished.  Respiratory: Effort normal.  Neurological: She is alert and oriented to person, place, and time.  Psychiatric: She has a normal mood and affect.           Assessment & Plan:

## 2018-11-24 DIAGNOSIS — Z01419 Encounter for gynecological examination (general) (routine) without abnormal findings: Secondary | ICD-10-CM | POA: Diagnosis not present

## 2019-01-22 ENCOUNTER — Other Ambulatory Visit: Payer: Self-pay | Admitting: Primary Care

## 2019-01-22 DIAGNOSIS — G44229 Chronic tension-type headache, not intractable: Secondary | ICD-10-CM

## 2019-01-22 DIAGNOSIS — G43C0 Periodic headache syndromes in child or adult, not intractable: Secondary | ICD-10-CM

## 2019-01-22 NOTE — Telephone Encounter (Signed)
See my chart message

## 2019-01-22 NOTE — Telephone Encounter (Signed)
Last prescribed on 10/31/2018. Last appointment on 10/31/2018. No future appointment

## 2019-01-27 DIAGNOSIS — Z309 Encounter for contraceptive management, unspecified: Secondary | ICD-10-CM | POA: Diagnosis not present

## 2019-01-29 ENCOUNTER — Other Ambulatory Visit: Payer: Self-pay | Admitting: Primary Care

## 2019-01-29 DIAGNOSIS — G44229 Chronic tension-type headache, not intractable: Secondary | ICD-10-CM

## 2019-02-04 ENCOUNTER — Encounter (HOSPITAL_BASED_OUTPATIENT_CLINIC_OR_DEPARTMENT_OTHER): Payer: Self-pay | Admitting: Obstetrics and Gynecology

## 2019-02-04 ENCOUNTER — Other Ambulatory Visit: Payer: Self-pay

## 2019-02-07 ENCOUNTER — Other Ambulatory Visit (HOSPITAL_COMMUNITY)
Admission: RE | Admit: 2019-02-07 | Discharge: 2019-02-07 | Disposition: A | Discharge status: Home / Self Care | Payer: BC Managed Care – PPO | Source: Ambulatory Visit | Attending: Obstetrics and Gynecology | Admitting: Obstetrics and Gynecology

## 2019-02-07 ENCOUNTER — Other Ambulatory Visit: Payer: Self-pay | Admitting: Obstetrics and Gynecology

## 2019-02-07 DIAGNOSIS — Z01812 Encounter for preprocedural laboratory examination: Secondary | ICD-10-CM | POA: Insufficient documentation

## 2019-02-07 DIAGNOSIS — Z20822 Contact with and (suspected) exposure to covid-19: Secondary | ICD-10-CM | POA: Diagnosis not present

## 2019-02-07 LAB — SARS CORONAVIRUS 2 (TAT 6-24 HRS): SARS Coronavirus 2: NEGATIVE

## 2019-02-07 NOTE — H&P (Signed)
Subjective: Chief Complaint(s):   pre op- Desire for permanent sterilization   HPI:  Isolation Precautions 1. Is fever present / reported?: No, 2. Are respiratory illness symptom(s) present / reported?: No, 3. Are other symptom(s) present / reported?: No, 5. Has there been reported travel to a High Risk respiratory illness region?: Unknown, 6. Has close* contact with person(s) known to have communicable illness been reported?: No" label="Respiratory Illness Screening" propId="[id number]" catId="[id number]" encId="[id number]"Respiratory Illness Screening 1. Is fever present / reported? No, 2. Are respiratory illness symptom(s) present / reported? No, 3. Are other symptom(s) present / reported? No, 5. Has there been reported travel to a High Risk respiratory illness region? Unknown, 6. Has close* contact with person(s) known to have communicable illness been reported? No. yes" label="Has patient been tested for COVID-19?" propId="25048" catId="[id number]" encId="[id number]"Has patient been tested for COVID-19? yes" itemId="25048" categoryId="[id number]"yes. september 2020, positive" label="Result and date of COVID test:" propId="25049" catId="[id number]" encId="[id number]"Result and date of COVID test: september 2020, positive" itemId="25049" categoryId="[id number]"september 2020, positive.  General 39 yo presents for a pre-operative visit for a BTL. Pt is scheduled for a laparoscopic tubal ligation on Feb 11, 2019 @ 7:30 for contraceptive management. Pt was last seen on Nov 24, 2018 in which she expressed interest in pursuing BTL for contraceptive management. She was informed that a BTL may make her menses heavier. She has been using a Mirena IUD placed on Feb 23, 2015. Pt reported doing well with Mirena on Nov 24, 2018. Pt requested removal of Mirena IUD on date of surgery. Pelvic examination was overall normal with no masses or abnormal cervical findings. Current Medication: Taking Valtrex(valACYclovir HCl) 500MG  Tablet 1 tablet  Orally Once a day. Mirena 20 MCG/24HR Intrauterine Device as directed Intrauterine. Multivitamins Tablet 1 tablet Orally once a day. Amitriptyline HCl 50 MG Tablet 1 tablet Orally Once a day. Not-Taking Maxalt(Rizatriptan Benzoate) , Notes: 5mg  prn Migrraine. Medication List reviewed and reconciled with the patient. Medical History:  Migraine headaches (04/2011)      Allergies/Intolerance: N.K.D.A. Gyn History:  Sexual activity currently sexually active. Periods : irregular IUD. LMP no cycle with IUD. Birth control Mirena IUD 02/23/2015. Last pap smear date 04/09/16-negative . Denies Last mammogram date N/A. STD HPV. Menarche 74.   OB History:  Number of pregnancies 2. Pregnancy # 1 live birth, vaginal delivery, girl 2011. Pregnancy # 2 live birth, vaginal delivery, boy 2003.   Surgical History:  Breast Augmentation 2008     Tummy Tuck 05/08/12   Hospitalization:  childbirth x 2 [facility]   Family History:  Father: alive, HTN, CAD s/p MI, diagnosed with Hypertension    Mother: alive, dental /sinus surgeries    1 son(s) , 1 daughter(s) .    materal cousin had cervical cancer.  Social History: General Tobacco use cigarettes: Never smoked, Tobacco history last updated 01/26/2019.  no EXPOSURE TO PASSIVE SMOKE.  no Alcohol.  Caffeine: yes, very little, 1 serving daily, 2 servings daily, coffee, soda, tea.  DIET: yes, regular, drinks water.  Exercise: yes, nothing structured.  Marital Status: married.  Children: 1, Boys, 1, girls.  OCCUPATION: unemployed, stay at home mom.  Seat belt use: yes.   ROS: CONSTITUTIONAL No" label="Chills" value="" options="no,yes" propid="91" itemid="193425" categoryid="10464" encounterid="[id number]"Chills No. No" label="Fatigue" value="" options="no,yes" propid="91" itemid="172899" categoryid="10464" encounterid="[id number]"Fatigue No. No" label="Fever" value="" options="no,yes" propid="91" itemid="10467" categoryid="10464" encounterid="[id number]"Fever No. No"  label="Night sweats" value="" options="no,yes" propid="91" itemid="193426" categoryid="10464" encounterid="12096010"Night sweats No. No" label="Recent travel outside [place]" value="" options="no,yes" propid="91" itemid="444261" categoryid="10464" encounterid="[id number]"Recent travel outside [place] No. No" label="Sweats"  value="" options="no,yes" propid="91" itemid="193427" categoryid="10464" encounterid="[id number]"Sweats No. No" label="Weight change" value="" options="no,yes" propid="91" itemid="194825" categoryid="10464" encounterid="12096010"Weight change No.  OPHTHALMOLOGY no" label="Blurring of vision" value="" options="no,yes" propid="91" itemid="12520" categoryid="12516" encounterid="12096010"Blurring of vision no. no" label="Change in vision" value="" options="no,yes" propid="91" itemid="193469" categoryid="12516" encounterid="12096010"Change in vision no. no" label="Double vision" value="" options="no,yes" propid="91" itemid="194379" categoryid="12516" encounterid="12096010"Double vision no.  ENT no" label="Dizziness" value="" options="no,yes" propid="91" itemid="193612" categoryid="10481" encounterid="[id number]"Dizziness no. Nose bleeds no. Sore throat no. Teeth pain no.  ALLERGY no" label="Hives" value="" options="no,yes" propid="91" itemid="202589" categoryid="138152" encounterid="12096010"Hives no.  CARDIOLOGY no" label="Chest pain" value="" options="no,yes" propid="91" itemid="193603" categoryid="10488" encounterid="[id number]"Chest pain no. no" label="High blood pressure" value="" options="no,yes" propid="91" itemid="199089" categoryid="10488" encounterid="[id number]"High blood pressure no. no" label="Irregular heart beat" value="" options="no,yes" propid="91" itemid="202598" categoryid="10488" encounterid="12096010"Irregular heart beat no. no" label="Leg edema" value="" options="no,yes" propid="91" itemid="10491" categoryid="10488" encounterid="[id number]"Leg edema no. no" label="Palpitations" value=""  options="no,yes" propid="91" itemid="10490" categoryid="10488" encounterid="12096010"Palpitations no.  RESPIRATORY no" label="Shortness of breath" value="" options="no" propid="91" itemid="270013" categoryid="138132" encounterid="12096010"Shortness of breath no. no" label="Cough" value="" options="no,yes" propid="91" itemid="172745" categoryid="138132" encounterid="12096010"Cough no. no" label="Wheezing" value="" options="no,yes" propid="91" itemid="193621" categoryid="138132" encounterid="12096010"Wheezing no.  UROLOGY no" label="Pain with urination" value="" options="no,yes" propid="91" itemid="194377" categoryid="138166" encounterid="12096010"Pain with urination no. no" label="Urinary urgency" value="" options="no,yes" propid="91" itemid="193493" categoryid="138166" encounterid="12096010"Urinary urgency no. no" label="Urinary frequency" value="" options="no,yes" propid="91" itemid="193492" categoryid="138166" encounterid="12096010"Urinary frequency no. no" label="Urinary incontinence" value="" options="no,yes" propid="91" itemid="138171" categoryid="138166" encounterid="12096010"Urinary incontinence no. No" label="Difficulty urinating" value="" options="no,yes" propid="91" itemid="138167" categoryid="138166" encounterid="12096010"Difficulty urinating No. No" label="Blood in urine" value="" options="no,yes" propid="91" itemid="138168" categoryid="138166" encounterid="12096010"Blood in urine No.  GASTROENTEROLOGY no" label="Abdominal pain" value="" options="no,yes" propid="91" itemid="10496" categoryid="10494" encounterid="12096010"Abdominal pain no. no" label="Appetite change" value="" options="no,yes" propid="91" itemid="193447" categoryid="10494" encounterid="12096010"Appetite change no. no" label="Bloating/belching" value="" options="no,yes" propid="91" itemid="193448" categoryid="10494" encounterid="12096010"Bloating/belching no. no" label="Blood in stool or on toilet paper" value="" options="no,yes"  propid="91" itemid="10503" categoryid="10494" encounterid="12096010"Blood in stool or on toilet paper no. no" label="Change in bowel movements" value="" options="no,yes" propid="91" itemid="199106" categoryid="10494" encounterid="12096010"Change in bowel movements no. no" label="Constipation" value="" options="no,yes" propid="91" itemid="10501" categoryid="10494" encounterid="12096010"Constipation no. no" label="Diarrhea" value="" options="no,yes" propid="91" itemid="10502" categoryid="10494" encounterid="12096010"Diarrhea no. no" label="Difficulty swallowing" value="" options="no,yes" propid="91" itemid="199104" categoryid="10494" encounterid="12096010"Difficulty swallowing no. no" label="Nausea" value="" options="no,yes" propid="91" itemid="10499" categoryid="10494" encounterid="12096010"Nausea no.  FEMALE REPRODUCTIVE no" label="Vulvar pain" value="" options="no,yes" propid="91" itemid="453725" categoryid="10525" encounterid="12096010"Vulvar pain no. no" label="Vulvar rash" value="" options="no,yes" propid="91" itemid="453726" categoryid="10525" encounterid="12096010"Vulvar rash no. no" label="Abnormal vaginal bleeding" value="" options="no, yes" propid="91" itemid="444315" categoryid="10525" encounterid="12096010"Abnormal vaginal bleeding no. no" label="Breast pain" value="" options="no,yes" propid="91" itemid="186083" categoryid="10525" encounterid="12096010"Breast pain no. no" label="Nipple discharge" value="" options="no,yes" propid="91" itemid="186084" categoryid="10525" encounterid="12096010"Nipple discharge no. no" label="Pain with intercourse" value="" options="no,yes" propid="91" itemid="275823" categoryid="10525" encounterid="12096010"Pain with intercourse no. no" label="Pelvic pain" value="" options="no,yes" propid="91" itemid="186082" categoryid="10525" encounterid="12096010"Pelvic pain no. no" label="Unusual vaginal discharge" value="" options="no,yes" propid="91" itemid="278230" categoryid="10525"  encounterid="[id number]"Unusual vaginal discharge no. no" label="Vaginal itching" value="" options="no,yes" propid="91" itemid="278942" categoryid="10525" encounterid="12096010"Vaginal itching no.  MUSCULOSKELETAL no" label="Muscle aches" value="" options="no,yes" propid="91" itemid="193461" categoryid="10514" encounterid="12096010"Muscle aches no.  NEUROLOGY no" label="Headache" value="" options="no,yes" propid="91" itemid="12513" categoryid="12512" encounterid="12096010"Headache no. no" label="Tingling/numbness" value="" options="no,yes" propid="91" itemid="12514" categoryid="12512" encounterid="12096010"Tingling/numbness no. no" label="Weakness" value="" options="no,yes" propid="91" itemid="193468" categoryid="12512" encounterid="12096010"Weakness no.  PSYCHOLOGY no" label="Depression" value="" options="" propid="91" itemid="275919" categoryid="10520" encounterid="12096010"Depression no. no" label="Anxiety" value="" options="no,yes" propid="91" itemid="172748" categoryid="10520" encounterid="12096010"Anxiety no. no" label="Nervousness" value="" options="no,yes" propid="91" itemid="199158" categoryid="10520" encounterid="12096010"Nervousness no. no" label="Sleep disturbances" value="" options="no,yes" propid="91" itemid="12502" categoryid="10520" encounterid="12096010"Sleep disturbances no. no " label="Suicidal ideation" value="" options="no,yes" propid="91" itemid="72718" categoryid="10520" encounterid="12096010"Suicidal ideation no .  ENDOCRINOLOGY no" label="Excessive thirst" value="" options="no,yes" propid="91" itemid="194628" categoryid="12508" encounterid="12096010"Excessive thirst no. no" label="Excessive urination" value="" options="no,yes" propid="91" itemid="196285" categoryid="12508" encounterid="12096010"Excessive urination no. no" label="Hair loss" value="" options="no, yes" propid="91" itemid="444314" categoryid="12508" encounterid="12096010"Hair  loss no. no" label="Heat or cold intolerance"  value="" options="" propid="91" itemid="447284" categoryid="12508" encounterid="12096010"Heat or cold intolerance no.  HEMATOLOGY/LYMPH no" label="Abnormal bleeding" value="" options="no,yes" propid="91" itemid="199152" categoryid="138157" encounterid="12096010"Abnormal bleeding no. no" label="Easy bruising" value="" options="no,yes" propid="91" itemid="170653" categoryid="138157" encounterid="12096010"Easy bruising no. no" label="Swollen glands" value="" options="no,yes" propid="91" itemid="138158" categoryid="138157" encounterid="12096010"Swollen glands no.  DERMATOLOGY no" label="New/changing skin lesion" value="" options="no,yes" propid="91" itemid="199126" categoryid="12503" encounterid="12096010"New/changing skin lesion no. no" label="Rash" value="" options="no,yes" propid="91" itemid="12504" categoryid="12503" encounterid="12096010"Rash no. no" label="Sores" value="" options="" propid="91" itemid="444313" categoryid="12503" encounterid="12096010"Sores no.   Negative except as stated in HPI.  Objective: Vitals: Wt 170.8, Wt change 6.8 lb, Ht 61, BMI 32.27, Temp 97.8, BP sitting 118/80.  Past Results: Examination:  General Examination alert, oriented, NAD " label="CONSTITUTIONAL:" categoryPropId="10089" examid="193638"CONSTITUTIONAL: alert, oriented, NAD .  moist, warm" label="SKIN:" categoryPropId="10109" examid="193638"SKIN: moist, warm.  Conjunctiva clear" label="EYES:" categoryPropId="21468" examid="193638"EYES: Conjunctiva clear.  clear to auscultation bilaterally" label="LUNGS:" categoryPropId="87" examid="193638"LUNGS: clear to auscultation bilaterally.  regular rate and rhythm" label="HEART:" categoryPropId="86" examid="193638"HEART: regular rate and rhythm.  soft, non-tender/non-distended, bowel sounds present " label="ABDOMEN:" categoryPropId="88" examid="193638"ABDOMEN: soft, non-tender/non-distended, bowel sounds present .  normal external genitalia, labia - unremarkable, vagina -  pink moist mucosa, no lesions or abnormal discharge, cervix - no discharge or lesions or CMT, adnexa - no masses or tenderness, uterus - nontender and normal size on palpation " label="FEMALE GENITOURINARY:" categoryPropId="13414" examid="193638"FEMALE GENITOURINARY: normal external genitalia, labia - unremarkable, vagina - pink moist mucosa, no lesions or abnormal discharge, cervix - no discharge or lesions or CMT, adnexa - no masses or tenderness, uterus - nontender and normal size on palpation .  no edema present" label="EXTREMITIES:" categoryPropId="89" examid="193638"EXTREMITIES: no edema present.  affect normal, good eye contact" label="PSYCH:" categoryPropId="16316" examid="193638"PSYCH: affect normal, good eye contact.  Physical Examination: Chaperone present Brothers,Sadie 01/27/2019 10:32:47 AM &gt; , for pelvic exam" label="Chaperone present" itemId="278390" categoryId="275238"Chaperone present Brothers,Sadie 01/27/2019 10:32:47 AM > , for pelvic exam.   Pt aware of scribe services today.   Assessment: Assessment:  Encounter for contraceptive management - Z30.9 (Primary)     Plan: Treatment: Encounter for contraceptive management Notes: Pt is scheduled for a laparoscopic tubal ligation on Feb 11, 2019 @ 7:30 for contraceptive management. Discussed that reversal of BTL will not be covered by insurance. Discussed w/ pt procedure, risks, benefits, and alternatives of BTL including but not limited to infection/bleeding, bladderand surrounding organs with the need for further surgery. Discussed that this is 99 % efective with 1 % r/o failure and 50 % risk of ectopic pregancy if failure occurs. Discussed avoidance of NSAIDs between now and surgery. She is to take Tylenol only for pain management. Will remove Mdamage to bowel, irena IUD on day of procedure. Advised pt to avoid driving for 3-5 days following procedure. Post-op follow up will be scheduled for 2 wks after BTL.

## 2019-02-07 NOTE — H&P (Deleted)
  The note originally documented on this encounter has been moved the the encounter in which it belongs.  

## 2019-02-11 ENCOUNTER — Encounter (HOSPITAL_BASED_OUTPATIENT_CLINIC_OR_DEPARTMENT_OTHER): Payer: Self-pay | Admitting: Obstetrics and Gynecology

## 2019-02-11 ENCOUNTER — Ambulatory Visit (HOSPITAL_BASED_OUTPATIENT_CLINIC_OR_DEPARTMENT_OTHER)
Admission: RE | Admit: 2019-02-11 | Discharge: 2019-02-11 | Disposition: A | Discharge status: Home / Self Care | Payer: BC Managed Care – PPO | Attending: Obstetrics and Gynecology | Admitting: Obstetrics and Gynecology

## 2019-02-11 ENCOUNTER — Other Ambulatory Visit: Payer: Self-pay

## 2019-02-11 ENCOUNTER — Ambulatory Visit (HOSPITAL_BASED_OUTPATIENT_CLINIC_OR_DEPARTMENT_OTHER): Payer: BC Managed Care – PPO | Admitting: Certified Registered"

## 2019-02-11 ENCOUNTER — Encounter (HOSPITAL_BASED_OUTPATIENT_CLINIC_OR_DEPARTMENT_OTHER)
Admission: RE | Disposition: A | Discharge status: Home / Self Care | Payer: Self-pay | Source: Home / Self Care | Attending: Obstetrics and Gynecology

## 2019-02-11 DIAGNOSIS — Z30432 Encounter for removal of intrauterine contraceptive device: Secondary | ICD-10-CM | POA: Insufficient documentation

## 2019-02-11 DIAGNOSIS — F329 Major depressive disorder, single episode, unspecified: Secondary | ICD-10-CM | POA: Diagnosis not present

## 2019-02-11 DIAGNOSIS — Z302 Encounter for sterilization: Secondary | ICD-10-CM | POA: Insufficient documentation

## 2019-02-11 DIAGNOSIS — Z79899 Other long term (current) drug therapy: Secondary | ICD-10-CM | POA: Diagnosis not present

## 2019-02-11 HISTORY — PX: IUD REMOVAL: SHX5392

## 2019-02-11 HISTORY — PX: LAPAROSCOPIC TUBAL LIGATION: SHX1937

## 2019-02-11 HISTORY — DX: Other specified postprocedural states: Z98.890

## 2019-02-11 HISTORY — DX: Other specified postprocedural states: R11.2

## 2019-02-11 LAB — POCT HEMOGLOBIN-HEMACUE: Hemoglobin: 13.7 g/dL (ref 12.0–15.0)

## 2019-02-11 LAB — POCT PREGNANCY, URINE: Preg Test, Ur: NEGATIVE

## 2019-02-11 SURGERY — LIGATION, FALLOPIAN TUBE, LAPAROSCOPIC
Anesthesia: General | Site: Uterus

## 2019-02-11 MED ORDER — ACETAMINOPHEN 500 MG PO TABS
ORAL_TABLET | ORAL | Status: AC
  Filled 2019-02-11: qty 2

## 2019-02-11 MED ORDER — LIDOCAINE 2% (20 MG/ML) 5 ML SYRINGE
INTRAMUSCULAR | Status: AC
  Filled 2019-02-11: qty 5

## 2019-02-11 MED ORDER — MIDAZOLAM HCL 2 MG/2ML IJ SOLN
INTRAMUSCULAR | Status: AC
  Filled 2019-02-11: qty 2

## 2019-02-11 MED ORDER — FENTANYL CITRATE (PF) 100 MCG/2ML IJ SOLN
INTRAMUSCULAR | Status: AC
  Filled 2019-02-11: qty 2

## 2019-02-11 MED ORDER — PROPOFOL 10 MG/ML IV BOLUS
INTRAVENOUS | Status: AC
  Filled 2019-02-11: qty 20

## 2019-02-11 MED ORDER — LACTATED RINGERS IV SOLN: INTRAVENOUS | Status: DC

## 2019-02-11 MED ORDER — HYDROCODONE-ACETAMINOPHEN 5-325 MG PO TABS: 1.0000 | ORAL_TABLET | Freq: Four times a day (QID) | ORAL | 0 refills | Status: DC | PRN

## 2019-02-11 MED ORDER — OXYCODONE HCL 5 MG/5ML PO SOLN: 5.0000 mg | Freq: Once | ORAL | Status: AC | PRN

## 2019-02-11 MED ORDER — IBUPROFEN 800 MG PO TABS: 800.0000 mg | ORAL_TABLET | Freq: Three times a day (TID) | ORAL | 0 refills | Status: AC | PRN

## 2019-02-11 MED ORDER — KETOROLAC TROMETHAMINE 30 MG/ML IJ SOLN
30.0000 mg | Freq: Once | INTRAMUSCULAR | Status: AC | PRN
  Administered 2019-02-11: 30 mg via INTRAVENOUS

## 2019-02-11 MED ORDER — HYDROMORPHONE HCL 1 MG/ML IJ SOLN
INTRAMUSCULAR | Status: AC
  Filled 2019-02-11: qty 0.5

## 2019-02-11 MED ORDER — MIDAZOLAM HCL 2 MG/2ML IJ SOLN: 1.0000 mg | INTRAMUSCULAR | Status: DC | PRN

## 2019-02-11 MED ORDER — FENTANYL CITRATE (PF) 250 MCG/5ML IJ SOLN
INTRAMUSCULAR | Status: DC | PRN
  Administered 2019-02-11 (×2): 50 ug via INTRAVENOUS

## 2019-02-11 MED ORDER — OXYCODONE HCL 5 MG PO TABS
5.0000 mg | ORAL_TABLET | Freq: Once | ORAL | Status: AC | PRN
  Administered 2019-02-11: 15:00:00 5 mg via ORAL

## 2019-02-11 MED ORDER — PROMETHAZINE HCL 25 MG/ML IJ SOLN: 6.2500 mg | INTRAMUSCULAR | Status: DC | PRN

## 2019-02-11 MED ORDER — KETOROLAC TROMETHAMINE 30 MG/ML IJ SOLN
INTRAMUSCULAR | Status: AC
  Filled 2019-02-11: qty 1

## 2019-02-11 MED ORDER — SILVER NITRATE-POT NITRATE 75-25 % EX MISC
CUTANEOUS | Status: AC
  Filled 2019-02-11: qty 10

## 2019-02-11 MED ORDER — SCOPOLAMINE 1 MG/3DAYS TD PT72
1.0000 | MEDICATED_PATCH | TRANSDERMAL | Status: DC
  Administered 2019-02-11: 13:00:00 1.5 mg via TRANSDERMAL

## 2019-02-11 MED ORDER — ONDANSETRON HCL 4 MG/2ML IJ SOLN
INTRAMUSCULAR | Status: DC | PRN
  Administered 2019-02-11: 4 mg via INTRAVENOUS

## 2019-02-11 MED ORDER — FENTANYL CITRATE (PF) 100 MCG/2ML IJ SOLN: 50.0000 ug | INTRAMUSCULAR | Status: DC | PRN

## 2019-02-11 MED ORDER — BUPIVACAINE HCL (PF) 0.25 % IJ SOLN
INTRAMUSCULAR | Status: DC | PRN
  Administered 2019-02-11: 10 mL

## 2019-02-11 MED ORDER — MEPERIDINE HCL 25 MG/ML IJ SOLN: 6.2500 mg | INTRAMUSCULAR | Status: DC | PRN

## 2019-02-11 MED ORDER — LIDOCAINE 2% (20 MG/ML) 5 ML SYRINGE
INTRAMUSCULAR | Status: DC | PRN
  Administered 2019-02-11: 100 mg via INTRAVENOUS

## 2019-02-11 MED ORDER — PROPOFOL 10 MG/ML IV BOLUS
INTRAVENOUS | Status: DC | PRN
  Administered 2019-02-11: 200 mg via INTRAVENOUS

## 2019-02-11 MED ORDER — SCOPOLAMINE 1 MG/3DAYS TD PT72
MEDICATED_PATCH | TRANSDERMAL | Status: AC
  Filled 2019-02-11: qty 1

## 2019-02-11 MED ORDER — OXYCODONE HCL 5 MG PO TABS
ORAL_TABLET | ORAL | Status: AC
  Filled 2019-02-11: qty 1

## 2019-02-11 MED ORDER — ROCURONIUM BROMIDE 10 MG/ML (PF) SYRINGE
PREFILLED_SYRINGE | INTRAVENOUS | Status: DC | PRN
  Administered 2019-02-11: 50 mg via INTRAVENOUS
  Administered 2019-02-11: 30 mg via INTRAVENOUS

## 2019-02-11 MED ORDER — HYDROMORPHONE HCL 1 MG/ML IJ SOLN
0.2500 mg | INTRAMUSCULAR | Status: DC | PRN
  Administered 2019-02-11 (×2): 0.5 mg via INTRAVENOUS

## 2019-02-11 MED ORDER — CELECOXIB 200 MG PO CAPS
ORAL_CAPSULE | ORAL | Status: AC
  Filled 2019-02-11: qty 2

## 2019-02-11 MED ORDER — ROCURONIUM BROMIDE 10 MG/ML (PF) SYRINGE
PREFILLED_SYRINGE | INTRAVENOUS | Status: AC
  Filled 2019-02-11: qty 10

## 2019-02-11 MED ORDER — DEXAMETHASONE SODIUM PHOSPHATE 10 MG/ML IJ SOLN
INTRAMUSCULAR | Status: DC | PRN
  Administered 2019-02-11: 4 mg via INTRAVENOUS

## 2019-02-11 MED ORDER — ACETAMINOPHEN 500 MG PO TABS
1000.0000 mg | ORAL_TABLET | ORAL | Status: AC
  Administered 2019-02-11: 1000 mg via ORAL

## 2019-02-11 MED ORDER — CEFAZOLIN SODIUM-DEXTROSE 2-4 GM/100ML-% IV SOLN
2.0000 g | INTRAVENOUS | Status: AC
  Administered 2019-02-11: 13:00:00 2 g via INTRAVENOUS

## 2019-02-11 MED ORDER — MIDAZOLAM HCL 5 MG/5ML IJ SOLN
INTRAMUSCULAR | Status: DC | PRN
  Administered 2019-02-11: 2 mg via INTRAVENOUS

## 2019-02-11 MED ORDER — CEFAZOLIN SODIUM-DEXTROSE 2-4 GM/100ML-% IV SOLN
INTRAVENOUS | Status: AC
  Filled 2019-02-11: qty 100

## 2019-02-11 MED ORDER — SUGAMMADEX SODIUM 200 MG/2ML IV SOLN
INTRAVENOUS | Status: DC | PRN
  Administered 2019-02-11: 200 mg via INTRAVENOUS

## 2019-02-11 MED ORDER — CELECOXIB 400 MG PO CAPS
400.0000 mg | ORAL_CAPSULE | ORAL | Status: AC
  Administered 2019-02-11: 12:00:00 400 mg via ORAL

## 2019-02-11 MED ORDER — BUPIVACAINE HCL (PF) 0.25 % IJ SOLN
INTRAMUSCULAR | Status: AC
  Filled 2019-02-11: qty 30

## 2019-02-11 SURGICAL SUPPLY — 25 items
ADH SKN CLS APL DERMABOND .7 (GAUZE/BANDAGES/DRESSINGS) ×2
CATH ROBINSON RED A/P 16FR (CATHETERS) ×2 IMPLANT
DERMABOND ADVANCED (GAUZE/BANDAGES/DRESSINGS) ×1
DERMABOND ADVANCED .7 DNX12 (GAUZE/BANDAGES/DRESSINGS) ×2 IMPLANT
DILATOR CANAL MILEX (MISCELLANEOUS) IMPLANT
DRSG OPSITE POSTOP 3X4 (GAUZE/BANDAGES/DRESSINGS) ×1 IMPLANT
DURAPREP 26ML APPLICATOR (WOUND CARE) ×3 IMPLANT
GAUZE 4X4 16PLY RFD (DISPOSABLE) ×1 IMPLANT
GLOVE BIOGEL M 6.5 STRL (GLOVE) ×3 IMPLANT
GLOVE BIOGEL PI IND STRL 6.5 (GLOVE) ×4 IMPLANT
GLOVE BIOGEL PI IND STRL 7.0 (GLOVE) ×4 IMPLANT
GLOVE BIOGEL PI INDICATOR 6.5 (GLOVE) ×2
GLOVE BIOGEL PI INDICATOR 7.0 (GLOVE) ×2
GOWN STRL REUS W/TWL LRG LVL3 (GOWN DISPOSABLE) ×6 IMPLANT
PACK LAPAROSCOPY BASIN (CUSTOM PROCEDURE TRAY) ×3 IMPLANT
PACK TRENDGUARD 450 HYBRID PRO (MISCELLANEOUS) ×1 IMPLANT
PAD OB MATERNITY 4.3X12.25 (PERSONAL CARE ITEMS) ×3 IMPLANT
SLEEVE SCD COMPRESS KNEE MED (MISCELLANEOUS) ×3 IMPLANT
SOLUTION ELECTROLUBE (MISCELLANEOUS) ×3 IMPLANT
SUT VICRYL 0 UR6 27IN ABS (SUTURE) ×3 IMPLANT
SUT VICRYL 4-0 PS2 18IN ABS (SUTURE) ×3 IMPLANT
TOWEL GREEN STERILE FF (TOWEL DISPOSABLE) ×6 IMPLANT
TRENDGUARD 450 HYBRID PRO PACK (MISCELLANEOUS) ×3
TROCAR XCEL NON-BLD 11X100MML (ENDOMECHANICALS) ×3 IMPLANT
WARMER LAPAROSCOPE (MISCELLANEOUS) ×3 IMPLANT

## 2019-02-11 NOTE — Transfer of Care (Signed)
Immediate Anesthesia Transfer of Care Note  Patient: Danielle Saunders  Procedure(s) Performed: LAPAROSCOPIC TUBAL LIGATION (Bilateral Abdomen) INTRAUTERINE DEVICE (IUD) REMOVAL (N/A Uterus)  Patient Location: PACU  Anesthesia Type:General  Level of Consciousness: awake  Airway & Oxygen Therapy: Patient Spontanous Breathing and Patient connected to nasal cannula oxygen  Post-op Assessment: Report given to RN and Post -op Vital signs reviewed and stable  Post vital signs: Reviewed and stable  Last Vitals:  Vitals Value Taken Time  BP 125/86 02/11/19 1425  Temp    Pulse 97 02/11/19 1426  Resp 20 02/11/19 1426  SpO2 100 % 02/11/19 1426  Vitals shown include unvalidated device data.  Last Pain:  Vitals:   02/11/19 1130  TempSrc: Oral  PainSc: 0-No pain      Patients Stated Pain Goal: 3 ([id number])  Complications: No apparent anesthesia complications

## 2019-02-11 NOTE — Anesthesia Preprocedure Evaluation (Signed)
Anesthesia Evaluation  Patient identified by MRN, date of birth, ID band Patient awake    Reviewed: Allergy & Precautions, NPO status , Patient's Chart, lab work & pertinent test results  History of Anesthesia Complications (+) PONV and history of anesthetic complications  Airway Mallampati: II  TM Distance: >3 FB Neck ROM: Full    Dental no notable dental hx. (+) Dental Advisory Given   Pulmonary neg pulmonary ROS,    Pulmonary exam normal breath sounds clear to auscultation       Cardiovascular negative cardio ROS Normal cardiovascular exam Rhythm:Regular Rate:Normal     Neuro/Psych  Headaches, PSYCHIATRIC DISORDERS Depression    GI/Hepatic negative GI ROS, Neg liver ROS,   Endo/Other  negative endocrine ROS  Renal/GU negative Renal ROS     Musculoskeletal negative musculoskeletal ROS (+)   Abdominal (+) + obese,   Peds  Hematology negative hematology ROS (+)   Anesthesia Other Findings   Reproductive/Obstetrics negative OB ROS                             Anesthesia Physical Anesthesia Plan  ASA: II  Anesthesia Plan: General   Post-op Pain Management:    Induction: Intravenous  PONV Risk Score and Plan: 4 or greater and Ondansetron, Dexamethasone, Treatment may vary due to age or medical condition, Midazolam and Scopolamine patch - Pre-op  Airway Management Planned: Oral ETT  Additional Equipment: None  Intra-op Plan:   Post-operative Plan: Extubation in OR  Informed Consent: I have reviewed the patients History and Physical, chart, labs and discussed the procedure including the risks, benefits and alternatives for the proposed anesthesia with the patient or authorized representative who has indicated his/her understanding and acceptance.     Dental advisory given  Plan Discussed with: CRNA  Anesthesia Plan Comments:         Anesthesia Quick Evaluation

## 2019-02-11 NOTE — Anesthesia Postprocedure Evaluation (Signed)
Anesthesia Post Note  Patient: Danielle Saunders  Procedure(s) Performed: LAPAROSCOPIC TUBAL LIGATION (Bilateral Abdomen) INTRAUTERINE DEVICE (IUD) REMOVAL (N/A Uterus)     Patient location during evaluation: PACU Anesthesia Type: General Level of consciousness: sedated and patient cooperative Pain management: pain level controlled Vital Signs Assessment: post-procedure vital signs reviewed and stable Respiratory status: spontaneous breathing Cardiovascular status: stable Anesthetic complications: no    Last Vitals:  Vitals:   02/11/19 1515 02/11/19 1538  BP: (!) 136/99 (!) 125/96  Pulse: 88 86  Resp: 12 16  Temp:  36.5 C  SpO2: 99% 99%    Last Pain:  Vitals:   02/11/19 1538  TempSrc: Oral  PainSc: 2                  Nolon Nations

## 2019-02-11 NOTE — Anesthesia Procedure Notes (Signed)
Procedure Name: Intubation Date/Time: 02/11/2019 1:02 PM Performed by: Myna Bright, CRNA Pre-anesthesia Checklist: Patient identified, Emergency Drugs available, Suction available and Patient being monitored Patient Re-evaluated:Patient Re-evaluated prior to induction Oxygen Delivery Method: Circle system utilized Preoxygenation: Pre-oxygenation with 100% oxygen Induction Type: IV induction Ventilation: Mask ventilation without difficulty Laryngoscope Size: Mac and 3 Grade View: Grade I Tube type: Oral Tube size: 7.0 mm Number of attempts: 1 Airway Equipment and Method: Stylet Placement Confirmation: ETT inserted through vocal cords under direct vision,  positive ETCO2 and breath sounds checked- equal and bilateral Secured at: 21 cm Tube secured with: Tape Dental Injury: Teeth and Oropharynx as per pre-operative assessment

## 2019-02-11 NOTE — Op Note (Signed)
02/11/2019  2:22 PM  PATIENT:  Danielle Saunders  39 y.o. female  PRE-OPERATIVE DIAGNOSIS:  Encounter for contraceptive management  POST-OPERATIVE DIAGNOSIS:  Encounter for contraceptive management  PROCEDURE:  Procedure(s): LAPAROSCOPIC TUBAL LIGATION (Bilateral) INTRAUTERINE DEVICE (IUD) REMOVAL (N/A)  SURGEON:  Surgeon(s) and Role:    Christophe Louis, MD - Primary  PHYSICIAN ASSISTANT:   ASSISTANTS: none   ANESTHESIA:   general  EBL:  20 mL   BLOOD ADMINISTERED:none  DRAINS: none   LOCAL MEDICATIONS USED:  MARCAINE     SPECIMEN:  No Specimen  DISPOSITION OF SPECIMEN:  N/A  COUNTS:  YES  TOURNIQUET:  * No tourniquets in log *  DICTATION: .Dragon Dictation  PLAN OF CARE: Discharge to home after PACU  PATIENT DISPOSITION:  PACU - hemodynamically stable.   Delay start of Pharmacological VTE agent (>24hrs) due to surgical blood loss or risk of bleeding: not applicable  Findings normal external genitalia.. IUD strings seen 2 cm in length . Normal appearing uterus fallopian tube and ovaries.   Procedure: The patient was taken to the operating room #7  where she was placed under general anesthesia. She is placed in the dorsolithotomy position. She was prepped and draped in the usual sterile fashion. Speculum was placed into the vaginal vault. In the anterior lip of the cervix grasped with a single-tooth tenaculum. A uterine manipulator was placed without difficulty. The single-tooth tenaculum was  Removed. The speculum was removed.  Attention was turned to the umbilicus which was injected with 10 cc of quarter percent Marcaine. A 11 mm incision was made with the scalpel. A 11 mm trocar was placed under direct visualization. Entry into the peritoneum was visualized. The pneumoperitoneum was achieved with CO2 gas. Her  fallopian tubes and ovaries appeared normal.  Operative scope was inserted  the right fallopian tube was identified and followed out to the fimbriated . The  Kleppinger was used to cauterize  Along the ampullary  portion of the right fallopian  tube without difficulty.  The left fallopian tube was identified followed out to the fimbriated end. This was repeated on the Left fallopian tube. A laparoscopic scissor was used to incise along the cauterized portion bilaterally  Pneumoperitoneum was released. The umbilical trocar was removed under direct visualization. The fascia was closed with 0 Vicryl. The skin was closed with 4-0 Vicryl. The dermabond placed a over the skin incision.  Attention was turned to the vagina. The uterine manipulator was removed. The IUD strings were grasped with ring forceps and removed without difficulty. Excellent hemostasis was noted.   Patient was awakened from anesthesia taken to the recovery room awake and in stable condition.

## 2019-02-11 NOTE — Discharge Instructions (Signed)
  NO PAIN MEDICINE UNTIL 9:30   Post Anesthesia Home Care Instructions  Activity: Get plenty of rest for the remainder of the day. A responsible individual must stay with you for 24 hours following the procedure.  For the next 24 hours, DO NOT: -Drive a car -Paediatric nurse -Drink alcoholic beverages -Take any medication unless instructed by your physician -Make any legal decisions or sign important papers.  Meals: Start with liquid foods such as gelatin or soup. Progress to regular foods as tolerated. Avoid greasy, spicy, heavy foods. If nausea and/or vomiting occur, drink only clear liquids until the nausea and/or vomiting subsides. Call your physician if vomiting continues.  Special Instructions/Symptoms: Your throat may feel dry or sore from the anesthesia or the breathing tube placed in your throat during surgery. If this causes discomfort, gargle with warm salt water. The discomfort should disappear within 24 hours.  If you had a scopolamine patch placed behind your ear for the management of post- operative nausea and/or vomiting:  1. The medication in the patch is effective for 72 hours, after which it should be removed.  Wrap patch in a tissue and discard in the trash. Wash hands thoroughly with soap and water. 2. You may remove the patch earlier than 72 hours if you experience unpleasant side effects which may include dry mouth, dizziness or visual disturbances. 3. Avoid touching the patch. Wash your hands with soap and water after contact with the patch.

## 2019-02-11 NOTE — H&P (Signed)
Date of Initial H&P: 01/07/2020  History reviewed, patient examined, no change in status, stable for surgery.

## 2019-02-12 ENCOUNTER — Encounter: Payer: Self-pay | Admitting: *Deleted

## 2019-03-24 DIAGNOSIS — G43001 Migraine without aura, not intractable, with status migrainosus: Secondary | ICD-10-CM

## 2019-03-24 DIAGNOSIS — G44229 Chronic tension-type headache, not intractable: Secondary | ICD-10-CM

## 2019-03-26 MED ORDER — KETOROLAC TROMETHAMINE 10 MG PO TABS: 10.0000 mg | ORAL_TABLET | Freq: Three times a day (TID) | ORAL | 0 refills | Status: DC | PRN

## 2019-06-09 NOTE — Telephone Encounter (Signed)
In the last few OV, patient was seen for acute issues.  Last CPE was 02/18/2015

## 2019-06-30 ENCOUNTER — Ambulatory Visit (INDEPENDENT_AMBULATORY_CARE_PROVIDER_SITE_OTHER): Payer: BC Managed Care – PPO | Admitting: Primary Care

## 2019-06-30 ENCOUNTER — Encounter: Payer: Self-pay | Admitting: Primary Care

## 2019-06-30 ENCOUNTER — Other Ambulatory Visit: Payer: Self-pay

## 2019-06-30 VITALS — BP 122/80 | HR 82 | Temp 96.4°F | Ht 61.0 in | Wt 171.8 lb

## 2019-06-30 DIAGNOSIS — G43C0 Periodic headache syndromes in child or adult, not intractable: Secondary | ICD-10-CM | POA: Diagnosis not present

## 2019-06-30 DIAGNOSIS — D229 Melanocytic nevi, unspecified: Secondary | ICD-10-CM

## 2019-06-30 DIAGNOSIS — Z0000 Encounter for general adult medical examination without abnormal findings: Secondary | ICD-10-CM | POA: Diagnosis not present

## 2019-06-30 DIAGNOSIS — A6000 Herpesviral infection of urogenital system, unspecified: Secondary | ICD-10-CM | POA: Diagnosis not present

## 2019-06-30 DIAGNOSIS — Z1283 Encounter for screening for malignant neoplasm of skin: Secondary | ICD-10-CM

## 2019-06-30 DIAGNOSIS — G44229 Chronic tension-type headache, not intractable: Secondary | ICD-10-CM

## 2019-06-30 LAB — COMPREHENSIVE METABOLIC PANEL
ALT: 19 U/L (ref 0–35)
AST: 18 U/L (ref 0–37)
Albumin: 4.1 g/dL (ref 3.5–5.2)
Alkaline Phosphatase: 82 U/L (ref 39–117)
BUN: 10 mg/dL (ref 6–23)
CO2: 24 mEq/L (ref 19–32)
Calcium: 9 mg/dL (ref 8.4–10.5)
Chloride: 105 mEq/L (ref 96–112)
Creatinine, Ser: 0.9 mg/dL (ref 0.40–1.20)
GFR: 70.13 mL/min (ref >60.00)
Glucose, Bld: 95 mg/dL (ref 70–99)
Potassium: 4.1 mEq/L (ref 3.5–5.1)
Sodium: 136 mEq/L (ref 135–145)
Total Bilirubin: 0.4 mg/dL (ref 0.2–1.2)
Total Protein: 6.9 g/dL (ref 6.0–8.3)

## 2019-06-30 LAB — CBC
HCT: 41 % (ref 36.0–46.0)
Hemoglobin: 13.7 g/dL (ref 12.0–15.0)
MCHC: 33.3 g/dL (ref 30.0–36.0)
MCV: 85.7 fl (ref 78.0–100.0)
Platelets: 303 10*3/uL (ref 150.0–400.0)
RBC: 4.79 Mil/uL (ref 3.87–5.11)
RDW: 14.2 % (ref 11.5–15.5)
WBC: 7.7 10*3/uL (ref 4.0–10.5)

## 2019-06-30 LAB — LIPID PANEL
Cholesterol: 264 mg/dL — ABNORMAL HIGH (ref 0–200)
HDL: 55.3 mg/dL (ref >39.00)
NonHDL: 208.24
Total CHOL/HDL Ratio: 5
Triglycerides: 284 mg/dL — ABNORMAL HIGH (ref 0.0–149.0)
VLDL: 56.8 mg/dL — ABNORMAL HIGH (ref 0.0–40.0)

## 2019-06-30 LAB — LDL CHOLESTEROL, DIRECT: Direct LDL: 162 mg/dL

## 2019-06-30 NOTE — Assessment & Plan Note (Signed)
Denies concerns for depression or anxiety.

## 2019-06-30 NOTE — Assessment & Plan Note (Signed)
Overall improved, compliant to amitriptyline nightly. Will take Toradol and tizanidine if needed. Rare use of promethazine if needed. Continue same.

## 2019-06-30 NOTE — Assessment & Plan Note (Signed)
Immunizations UTD. Pap smear UTD, follows with GYN. Encouraged a healthy diet and regular exercise. Exam today unremarkable. Labs pending.

## 2019-06-30 NOTE — Assessment & Plan Note (Signed)
Compliant to daily valacyclovir 500 mg for which she gets from GYN. No recent outbreaks, continue same.

## 2019-06-30 NOTE — Patient Instructions (Addendum)
Stop by the lab prior to leaving today. I will notify you of your results once received.   You will be contacted regarding your referral to dermatology.  Please let us know if you have not been contacted within two weeks.   Start exercising. You should be getting 150 minutes of moderate intensity exercise weekly.  Continue to work on a healthy diet. Ensure you are consuming 64 ounces of water daily.  It was a pleasure to see you today!   Preventive Care 79-38 Years Old, Female Preventive care refers to visits with your health care provider and lifestyle choices that can promote health and wellness. This includes:  A yearly physical exam. This may also be called an annual well check.  Regular dental visits and eye exams.  Immunizations.  Screening for certain conditions.  Healthy lifestyle choices, such as eating a healthy diet, getting regular exercise, not using drugs or products that contain nicotine and tobacco, and limiting alcohol use. What can I expect for my preventive care visit? Physical exam Your health care provider will check your:  Height and weight. This may be used to calculate body mass index (BMI), which tells if you are at a healthy weight.  Heart rate and blood pressure.  Skin for abnormal spots. Counseling Your health care provider may ask you questions about your:  Alcohol, tobacco, and drug use.  Emotional well-being.  Home and relationship well-being.  Sexual activity.  Eating habits.  Work and work Statistician.  Method of birth control.  Menstrual cycle.  Pregnancy history. What immunizations do I need?  Influenza (flu) vaccine  This is recommended every year. Tetanus, diphtheria, and pertussis (Tdap) vaccine  You may need a Td booster every 10 years. Varicella (chickenpox) vaccine  You may need this if you have not been vaccinated. Human papillomavirus (HPV) vaccine  If recommended by your health care provider, you may need  three doses over 6 months. Measles, mumps, and rubella (MMR) vaccine  You may need at least one dose of MMR. You may also need a second dose. Meningococcal conjugate (MenACWY) vaccine  One dose is recommended if you are age 93-21 years and a first-year college student living in a residence hall, or if you have one of several medical conditions. You may also need additional booster doses. Pneumococcal conjugate (PCV13) vaccine  You may need this if you have certain conditions and were not previously vaccinated. Pneumococcal polysaccharide (PPSV23) vaccine  You may need one or two doses if you smoke cigarettes or if you have certain conditions. Hepatitis A vaccine  You may need this if you have certain conditions or if you travel or work in places where you may be exposed to hepatitis A. Hepatitis B vaccine  You may need this if you have certain conditions or if you travel or work in places where you may be exposed to hepatitis B. Haemophilus influenzae type b (Hib) vaccine  You may need this if you have certain conditions. You may receive vaccines as individual doses or as more than one vaccine together in one shot (combination vaccines). Talk with your health care provider about the risks and benefits of combination vaccines. What tests do I need?  Blood tests  Lipid and cholesterol levels. These may be checked every 5 years starting at age 58.  Hepatitis C test.  Hepatitis B test. Screening  Diabetes screening. This is done by checking your blood sugar (glucose) after you have not eaten for a while (fasting).  Sexually  transmitted disease (STD) testing.  BRCA-related cancer screening. This may be done if you have a family history of breast, ovarian, tubal, or peritoneal cancers.  Pelvic exam and Pap test. This may be done every 3 years starting at age 38. Starting at age 38, this may be done every 5 years if you have a Pap test in combination with an HPV test. Talk with your  health care provider about your test results, treatment options, and if necessary, the need for more tests. Follow these instructions at home: Eating and drinking   Eat a diet that includes fresh fruits and vegetables, whole grains, lean protein, and low-fat dairy.  Take vitamin and mineral supplements as recommended by your health care provider.  Do not drink alcohol if: ? Your health care provider tells you not to drink. ? You are pregnant, may be pregnant, or are planning to become pregnant.  If you drink alcohol: ? Limit how much you have to 0-1 drink a day. ? Be aware of how much alcohol is in your drink. In the [place] one drink equals one 12 oz bottle of beer (355 mL), one 5 oz glass of wine (148 mL), or one 1 oz glass of hard liquor (44 mL). Lifestyle  Take daily care of your teeth and gums.  Stay active. Exercise for at least 30 minutes on 5 or more days each week.  Do not use any products that contain nicotine or tobacco, such as cigarettes, e-cigarettes, and chewing tobacco. If you need help quitting, ask your health care provider.  If you are sexually active, practice safe sex. Use a condom or other form of birth control (contraception) in order to prevent pregnancy and STIs (sexually transmitted infections). If you plan to become pregnant, see your health care provider for a preconception visit. What's next?  Visit your health care provider once a year for a well check visit.  Ask your health care provider how often you should have your eyes and teeth checked.  Stay up to date on all vaccines. This information is not intended to replace advice given to you by your health care provider. Make sure you discuss any questions you have with your health care provider. Document Revised: 09/12/2017 Document Reviewed: 09/12/2017 Elsevier Patient Education  2020 [employer].

## 2019-06-30 NOTE — Progress Notes (Signed)
Subjective:    Patient ID: Danielle Saunders, female    DOB: April 08, 1980, 39 y.o.   MRN: [id number]  HPI  This visit occurred during the SARS-CoV-2 public health emergency.  Safety protocols were in place, including screening questions prior to the visit, additional usage of staff PPE, and extensive cleaning of exam room while observing appropriate contact time as indicated for disinfecting solutions.   Danielle Saunders is a 39 year old female who presents today for complete physical.  Immunizations: -Tetanus: Completed in 2017 -Influenza: Completed last season  -Covid-19: Has not received  Diet: She endorses a fair diet.  Exercise: She is not exercising, active at home.   Eye exam: Completed in 2021 Dental exam: Completes annually   Pap Smear: Completed in December 2020, follows with GYN.   BP Readings from Last 3 Encounters:  06/30/19 122/80  02/11/19 (!) 125/96  11/07/17 110/70     Review of Systems  Constitutional: Negative for unexpected weight change.  HENT: Negative for rhinorrhea.   Respiratory: Negative for cough and shortness of breath.   Cardiovascular: Negative for chest pain.  Gastrointestinal: Negative for constipation and diarrhea.  Genitourinary: Negative for difficulty urinating and menstrual problem.  Musculoskeletal: Negative for arthralgias and myalgias.  Skin: Negative for rash.  Allergic/Immunologic: Positive for environmental allergies.  Neurological: Positive for headaches. Negative for dizziness and numbness.       Overall migraines improved.   Psychiatric/Behavioral: The patient is not nervous/anxious.        Past Medical History:  Diagnosis Date  . Chronic tension headaches   . Genital herpes   . PONV (postoperative nausea and vomiting)      Social History   Socioeconomic History  . Marital status: Married    Spouse name: Not on file  . Number of children: Not on file  . Years of education: Not on file  . Highest education level: Not on  file  Occupational History  . Not on file  Tobacco Use  . Smoking status: Never Smoker  . Smokeless tobacco: Never Used  Substance and Sexual Activity  . Alcohol use: No    Alcohol/week: 0.0 standard drinks  . Drug use: Not on file  . Sexual activity: Not on file  Other Topics Concern  . Not on file  Social History Narrative   Married.   2 children.   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    Enjoys relaxing and spending time with her family.    Social Determinants of Health   Financial Resource Strain:   . Difficulty of Paying Living Expenses:   Food Insecurity:   . Worried About Charity fundraiser in the Last Year:   . Arboriculturist in the Last Year:   Transportation Needs:   . Film/video editor (Medical):   Marland Kitchen Lack of Transportation (Non-Medical):   Physical Activity:   . Days of Exercise per Week:   . Minutes of Exercise per Session:   Stress:   . Feeling of Stress :   Social Connections:   . Frequency of Communication with Friends and Family:   . Frequency of Social Gatherings with Friends and Family:   . Attends Religious Services:   . Active Member of Clubs or Organizations:   . Attends Archivist Meetings:   Marland Kitchen Marital Status:   Intimate Partner Violence:   . Fear of Current or Ex-Partner:   . Emotionally Abused:   Marland Kitchen Physically Abused:   . Sexually  Abused:     Past Surgical History:  Procedure Laterality Date  . ABDOMINOPLASTY    . BREAST ENHANCEMENT SURGERY    . IUD REMOVAL N/A 02/11/2019   Procedure: INTRAUTERINE DEVICE (IUD) REMOVAL;  Surgeon: Christophe Louis, MD;  Location: [place];  Service: Gynecology;  Laterality: N/A;  . LAPAROSCOPIC TUBAL LIGATION Bilateral 02/11/2019   Procedure: LAPAROSCOPIC TUBAL LIGATION;  Surgeon: Christophe Louis, MD;  Location: [place];  Service: Gynecology;  Laterality: Bilateral;    Family History  Problem Relation Age of Onset  . Hyperlipidemia Father     No Known  Allergies  Current Outpatient Medications on File Prior to Visit  Medication Sig Dispense Refill  . amitriptyline (ELAVIL) 50 MG tablet TAKE 1 TABLET BY MOUTH AT BEDTIME FOR HEADACHE PREVENTION. 90 tablet 1  . cholecalciferol (VITAMIN D3) 25 MCG (1000 UNIT) tablet Take 1,000 Units by mouth daily.    Marland Kitchen ibuprofen (ADVIL) 800 MG tablet Take 1 tablet (800 mg total) by mouth every 8 (eight) hours as needed. 30 tablet 0  . ketorolac (TORADOL) 10 MG tablet Take 1 tablet (10 mg total) by mouth every 8 (eight) hours as needed (migraines). 20 tablet 0  . Magnesium 100 MG CAPS Take by mouth.    . Multiple Vitamin (MULTIVITAMIN) tablet Take 1 tablet by mouth daily.    . Probiotic Product (PROBIOTIC-10 PO) Take by mouth.    . promethazine (PHENERGAN) 12.5 MG tablet Take 1-2 tablets (12.5-25 mg total) by mouth every 8 (eight) hours as needed for nausea or vomiting. 30 tablet 0  . tiZANidine (ZANAFLEX) 4 MG tablet Take 1-2 tablets by mouth twice daily as needed for migraines. Limit 1-2 treatments per week 30 tablet 0  . valACYclovir (VALTREX) 500 MG tablet TAKE ONE TABLET BY MOUTH EVERY 24 HOURS.  9   No current facility-administered medications on file prior to visit.    BP 122/80   Pulse 82   Temp (!) 96.4 F (35.8 C) (Temporal)   Ht 5\' 1"  (1.549 m)   Wt 171 lb 12 oz (77.9 kg)   LMP 05/30/2019   SpO2 97%   BMI 32.45 kg/m    Objective:   Physical Exam  Constitutional: She is oriented to person, place, and time.  HENT:  Right Ear: Tympanic membrane and ear canal normal.  Left Ear: Tympanic membrane and ear canal normal.  Eyes: Pupils are equal, round, and reactive to light.  Cardiovascular: Normal rate and regular rhythm.  Respiratory: Effort normal and breath sounds normal.  GI: Soft. Bowel sounds are normal. There is no abdominal tenderness.  Musculoskeletal:        General: Normal range of motion.     Cervical back: Neck supple.  Neurological: She is alert and oriented to person,  place, and time. No cranial nerve deficit.  Reflex Scores:      Patellar reflexes are 2+ on the right side and 2+ on the left side. Skin: Skin is warm and dry.  Psychiatric: Mood normal.           Assessment & Plan:

## 2019-07-16 ENCOUNTER — Other Ambulatory Visit: Payer: Self-pay | Admitting: Primary Care

## 2019-07-16 DIAGNOSIS — G44229 Chronic tension-type headache, not intractable: Secondary | ICD-10-CM

## 2019-07-16 DIAGNOSIS — G43C0 Periodic headache syndromes in child or adult, not intractable: Secondary | ICD-10-CM

## 2019-07-31 DIAGNOSIS — M530 Cervicocranial syndrome: Secondary | ICD-10-CM | POA: Diagnosis not present

## 2019-07-31 DIAGNOSIS — M9902 Segmental and somatic dysfunction of thoracic region: Secondary | ICD-10-CM | POA: Diagnosis not present

## 2019-07-31 DIAGNOSIS — M9901 Segmental and somatic dysfunction of cervical region: Secondary | ICD-10-CM | POA: Diagnosis not present

## 2019-07-31 DIAGNOSIS — M9908 Segmental and somatic dysfunction of rib cage: Secondary | ICD-10-CM | POA: Diagnosis not present

## 2019-08-05 DIAGNOSIS — M9902 Segmental and somatic dysfunction of thoracic region: Secondary | ICD-10-CM | POA: Diagnosis not present

## 2019-08-05 DIAGNOSIS — M9901 Segmental and somatic dysfunction of cervical region: Secondary | ICD-10-CM | POA: Diagnosis not present

## 2019-08-05 DIAGNOSIS — M9908 Segmental and somatic dysfunction of rib cage: Secondary | ICD-10-CM | POA: Diagnosis not present

## 2019-08-05 DIAGNOSIS — M530 Cervicocranial syndrome: Secondary | ICD-10-CM | POA: Diagnosis not present

## 2019-08-12 DIAGNOSIS — M9901 Segmental and somatic dysfunction of cervical region: Secondary | ICD-10-CM | POA: Diagnosis not present

## 2019-08-12 DIAGNOSIS — M9908 Segmental and somatic dysfunction of rib cage: Secondary | ICD-10-CM | POA: Diagnosis not present

## 2019-08-12 DIAGNOSIS — M530 Cervicocranial syndrome: Secondary | ICD-10-CM | POA: Diagnosis not present

## 2019-08-12 DIAGNOSIS — M9902 Segmental and somatic dysfunction of thoracic region: Secondary | ICD-10-CM | POA: Diagnosis not present

## 2019-08-14 DIAGNOSIS — M530 Cervicocranial syndrome: Secondary | ICD-10-CM | POA: Diagnosis not present

## 2019-08-14 DIAGNOSIS — M9902 Segmental and somatic dysfunction of thoracic region: Secondary | ICD-10-CM | POA: Diagnosis not present

## 2019-08-14 DIAGNOSIS — M9908 Segmental and somatic dysfunction of rib cage: Secondary | ICD-10-CM | POA: Diagnosis not present

## 2019-08-14 DIAGNOSIS — M9901 Segmental and somatic dysfunction of cervical region: Secondary | ICD-10-CM | POA: Diagnosis not present

## 2019-08-19 DIAGNOSIS — M530 Cervicocranial syndrome: Secondary | ICD-10-CM | POA: Diagnosis not present

## 2019-08-19 DIAGNOSIS — M9902 Segmental and somatic dysfunction of thoracic region: Secondary | ICD-10-CM | POA: Diagnosis not present

## 2019-08-19 DIAGNOSIS — M9908 Segmental and somatic dysfunction of rib cage: Secondary | ICD-10-CM | POA: Diagnosis not present

## 2019-08-19 DIAGNOSIS — M9901 Segmental and somatic dysfunction of cervical region: Secondary | ICD-10-CM | POA: Diagnosis not present

## 2019-08-21 DIAGNOSIS — M530 Cervicocranial syndrome: Secondary | ICD-10-CM | POA: Diagnosis not present

## 2019-08-21 DIAGNOSIS — M9902 Segmental and somatic dysfunction of thoracic region: Secondary | ICD-10-CM | POA: Diagnosis not present

## 2019-08-21 DIAGNOSIS — M9908 Segmental and somatic dysfunction of rib cage: Secondary | ICD-10-CM | POA: Diagnosis not present

## 2019-08-21 DIAGNOSIS — M9901 Segmental and somatic dysfunction of cervical region: Secondary | ICD-10-CM | POA: Diagnosis not present

## 2019-08-27 ENCOUNTER — Telehealth: Payer: Self-pay

## 2019-08-27 NOTE — Telephone Encounter (Signed)
noted 

## 2019-08-27 NOTE — Telephone Encounter (Signed)
"  Rejection Reason - Patient did not respond" [facility] said about 1 hour ago  "LM appointment on 7/27 and spoke to patient on 8/2 and she took contact info and said she would call back." [facility] said 9 days ago  "Left another message for patient to call and schedule appointment. Also informed that referral will be closed if no call back in 3 business days." [facility] said 7 days ago

## 2019-09-04 DIAGNOSIS — M9901 Segmental and somatic dysfunction of cervical region: Secondary | ICD-10-CM | POA: Diagnosis not present

## 2019-09-04 DIAGNOSIS — M9908 Segmental and somatic dysfunction of rib cage: Secondary | ICD-10-CM | POA: Diagnosis not present

## 2019-09-04 DIAGNOSIS — M9902 Segmental and somatic dysfunction of thoracic region: Secondary | ICD-10-CM | POA: Diagnosis not present

## 2019-09-04 DIAGNOSIS — M530 Cervicocranial syndrome: Secondary | ICD-10-CM | POA: Diagnosis not present

## 2019-09-09 ENCOUNTER — Other Ambulatory Visit: Payer: Self-pay

## 2019-09-09 ENCOUNTER — Other Ambulatory Visit: Payer: BC Managed Care – PPO

## 2019-09-09 DIAGNOSIS — Z20822 Contact with and (suspected) exposure to covid-19: Secondary | ICD-10-CM | POA: Diagnosis not present

## 2019-09-11 LAB — SARS-COV-2, NAA 2 DAY TAT

## 2019-09-11 LAB — NOVEL CORONAVIRUS, NAA: SARS-CoV-2, NAA: NOT DETECTED

## 2019-09-16 DIAGNOSIS — M9908 Segmental and somatic dysfunction of rib cage: Secondary | ICD-10-CM | POA: Diagnosis not present

## 2019-09-16 DIAGNOSIS — M9901 Segmental and somatic dysfunction of cervical region: Secondary | ICD-10-CM | POA: Diagnosis not present

## 2019-09-16 DIAGNOSIS — M530 Cervicocranial syndrome: Secondary | ICD-10-CM | POA: Diagnosis not present

## 2019-09-16 DIAGNOSIS — M9902 Segmental and somatic dysfunction of thoracic region: Secondary | ICD-10-CM | POA: Diagnosis not present

## 2019-09-18 DIAGNOSIS — M530 Cervicocranial syndrome: Secondary | ICD-10-CM | POA: Diagnosis not present

## 2019-09-18 DIAGNOSIS — M9901 Segmental and somatic dysfunction of cervical region: Secondary | ICD-10-CM | POA: Diagnosis not present

## 2019-09-18 DIAGNOSIS — M9908 Segmental and somatic dysfunction of rib cage: Secondary | ICD-10-CM | POA: Diagnosis not present

## 2019-09-18 DIAGNOSIS — M9902 Segmental and somatic dysfunction of thoracic region: Secondary | ICD-10-CM | POA: Diagnosis not present

## 2019-09-23 DIAGNOSIS — M530 Cervicocranial syndrome: Secondary | ICD-10-CM | POA: Diagnosis not present

## 2019-09-23 DIAGNOSIS — M9902 Segmental and somatic dysfunction of thoracic region: Secondary | ICD-10-CM | POA: Diagnosis not present

## 2019-09-23 DIAGNOSIS — M9901 Segmental and somatic dysfunction of cervical region: Secondary | ICD-10-CM | POA: Diagnosis not present

## 2019-09-23 DIAGNOSIS — M9908 Segmental and somatic dysfunction of rib cage: Secondary | ICD-10-CM | POA: Diagnosis not present

## 2019-10-06 DIAGNOSIS — M9908 Segmental and somatic dysfunction of rib cage: Secondary | ICD-10-CM | POA: Diagnosis not present

## 2019-10-06 DIAGNOSIS — M530 Cervicocranial syndrome: Secondary | ICD-10-CM | POA: Diagnosis not present

## 2019-10-06 DIAGNOSIS — M9902 Segmental and somatic dysfunction of thoracic region: Secondary | ICD-10-CM | POA: Diagnosis not present

## 2019-10-06 DIAGNOSIS — M9901 Segmental and somatic dysfunction of cervical region: Secondary | ICD-10-CM | POA: Diagnosis not present

## 2019-10-16 DIAGNOSIS — M530 Cervicocranial syndrome: Secondary | ICD-10-CM | POA: Diagnosis not present

## 2019-10-16 DIAGNOSIS — M9901 Segmental and somatic dysfunction of cervical region: Secondary | ICD-10-CM | POA: Diagnosis not present

## 2019-10-16 DIAGNOSIS — M9902 Segmental and somatic dysfunction of thoracic region: Secondary | ICD-10-CM | POA: Diagnosis not present

## 2019-10-16 DIAGNOSIS — M9908 Segmental and somatic dysfunction of rib cage: Secondary | ICD-10-CM | POA: Diagnosis not present

## 2019-10-23 DIAGNOSIS — M9902 Segmental and somatic dysfunction of thoracic region: Secondary | ICD-10-CM | POA: Diagnosis not present

## 2019-10-23 DIAGNOSIS — M530 Cervicocranial syndrome: Secondary | ICD-10-CM | POA: Diagnosis not present

## 2019-10-23 DIAGNOSIS — M9901 Segmental and somatic dysfunction of cervical region: Secondary | ICD-10-CM | POA: Diagnosis not present

## 2019-10-23 DIAGNOSIS — M9908 Segmental and somatic dysfunction of rib cage: Secondary | ICD-10-CM | POA: Diagnosis not present

## 2019-11-06 DIAGNOSIS — M9902 Segmental and somatic dysfunction of thoracic region: Secondary | ICD-10-CM | POA: Diagnosis not present

## 2019-11-06 DIAGNOSIS — M9908 Segmental and somatic dysfunction of rib cage: Secondary | ICD-10-CM | POA: Diagnosis not present

## 2019-11-06 DIAGNOSIS — M9901 Segmental and somatic dysfunction of cervical region: Secondary | ICD-10-CM | POA: Diagnosis not present

## 2019-11-06 DIAGNOSIS — M530 Cervicocranial syndrome: Secondary | ICD-10-CM | POA: Diagnosis not present

## 2019-12-04 DIAGNOSIS — M9902 Segmental and somatic dysfunction of thoracic region: Secondary | ICD-10-CM | POA: Diagnosis not present

## 2019-12-04 DIAGNOSIS — M9901 Segmental and somatic dysfunction of cervical region: Secondary | ICD-10-CM | POA: Diagnosis not present

## 2019-12-04 DIAGNOSIS — M530 Cervicocranial syndrome: Secondary | ICD-10-CM | POA: Diagnosis not present

## 2019-12-04 DIAGNOSIS — M9908 Segmental and somatic dysfunction of rib cage: Secondary | ICD-10-CM | POA: Diagnosis not present

## 2019-12-16 ENCOUNTER — Other Ambulatory Visit: Payer: Self-pay

## 2019-12-16 ENCOUNTER — Ambulatory Visit: Payer: BC Managed Care – PPO | Admitting: Primary Care

## 2019-12-16 ENCOUNTER — Encounter: Payer: Self-pay | Admitting: Primary Care

## 2019-12-16 VITALS — BP 120/62 | HR 119 | Temp 98.6°F | Ht 61.0 in | Wt 162.0 lb

## 2019-12-16 DIAGNOSIS — R109 Unspecified abdominal pain: Secondary | ICD-10-CM | POA: Diagnosis not present

## 2019-12-16 LAB — POC URINALSYSI DIPSTICK (AUTOMATED)
Bilirubin, UA: NEGATIVE
Blood, UA: NEGATIVE
Glucose, UA: NEGATIVE
Ketones, UA: NEGATIVE
Leukocytes, UA: NEGATIVE
Nitrite, UA: NEGATIVE
Protein, UA: POSITIVE — AB
Spec Grav, UA: 1.025 (ref 1.010–1.025)
Urobilinogen, UA: 0.2 E.U./dL
pH, UA: 6 (ref 5.0–8.0)

## 2019-12-16 NOTE — Patient Instructions (Addendum)
We will be in touch with your urine culture test results Friday this week.  Ensure you are consuming 64 ounces of water daily.  Okay to take Naproxen if needed.  It was a pleasure to see you today!

## 2019-12-16 NOTE — Assessment & Plan Note (Signed)
More lower back pain, left side. Exam today benign, normal ROM to lower back, no CVA tenderness, appears well.  UA today negative. Urine culture pending.  Likely MSK etiology, she will update if symptoms worsen.

## 2019-12-16 NOTE — Progress Notes (Signed)
Subjective:    Patient ID: Danielle Saunders, female    DOB: 1980/06/14, 39 y.o.   MRN: [id number]  HPI  This visit occurred during the SARS-CoV-2 public health emergency.  Safety protocols were in place, including screening questions prior to the visit, additional usage of staff PPE, and extensive cleaning of exam room while observing appropriate contact time as indicated for disinfecting solutions.   [name] is a 39 year old female with a chief complaint of left lower back pain.  Symptoms began three days ago with a sharp, constant left lower back pain that lasted for three hours then abated. Monday her symptoms returned, occurred intermittently, sharp, improved after she took Naproxen. Today she's feeling better, pain is more mild.   She denies hematuria, dysuria, pelvic pain, history of renal stones, fevers, vaginal symptoms, trauma/injury, overuse of back muscles.   Review of Systems  Constitutional: Negative for fever.  Gastrointestinal: Negative for abdominal pain and nausea.  Genitourinary: Negative for dysuria, flank pain, frequency, hematuria and vaginal discharge.  Musculoskeletal: Positive for back pain.       Past Medical History:  Diagnosis Date  . Chronic tension headaches   . DEPRESSIVE DISORDER, NOS 03/14/2006   Qualifier: Diagnosis of  By: Samara Snide    . Genital herpes   . PONV (postoperative nausea and vomiting)      Social History   Socioeconomic History  . Marital status: Married    Spouse name: Not on file  . Number of children: Not on file  . Years of education: Not on file  . Highest education level: Not on file  Occupational History  . Not on file  Tobacco Use  . Smoking status: Never Smoker  . Smokeless tobacco: Never Used  Substance and Sexual Activity  . Alcohol use: No    Alcohol/week: 0.0 standard drinks  . Drug use: Not on file  . Sexual activity: Not on file  Other Topics Concern  . Not on file  Social History Narrative   Married.     2 children.   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    Enjoys relaxing and spending time with her family.    Social Determinants of Health   Financial Resource Strain:   . Difficulty of Paying Living Expenses: Not on file  Food Insecurity:   . Worried About Charity fundraiser in the Last Year: Not on file  . Ran Out of Food in the Last Year: Not on file  Transportation Needs:   . Lack of Transportation (Medical): Not on file  . Lack of Transportation (Non-Medical): Not on file  Physical Activity:   . Days of Exercise per Week: Not on file  . Minutes of Exercise per Session: Not on file  Stress:   . Feeling of Stress : Not on file  Social Connections:   . Frequency of Communication with Friends and Family: Not on file  . Frequency of Social Gatherings with Friends and Family: Not on file  . Attends Religious Services: Not on file  . Active Member of Clubs or Organizations: Not on file  . Attends Archivist Meetings: Not on file  . Marital Status: Not on file  Intimate Partner Violence:   . Fear of Current or Ex-Partner: Not on file  . Emotionally Abused: Not on file  . Physically Abused: Not on file  . Sexually Abused: Not on file    Past Surgical History:  Procedure Laterality Date  . ABDOMINOPLASTY    .  BREAST ENHANCEMENT SURGERY    . IUD REMOVAL N/A 02/11/2019   Procedure: INTRAUTERINE DEVICE (IUD) REMOVAL;  Surgeon: Christophe Louis, MD;  Location: [place];  Service: Gynecology;  Laterality: N/A;  . LAPAROSCOPIC TUBAL LIGATION Bilateral 02/11/2019   Procedure: LAPAROSCOPIC TUBAL LIGATION;  Surgeon: Christophe Louis, MD;  Location: [place];  Service: Gynecology;  Laterality: Bilateral;    Family History  Problem Relation Age of Onset  . Hyperlipidemia Father     No Known Allergies  Current Outpatient Medications on File Prior to Visit  Medication Sig Dispense Refill  . amitriptyline (ELAVIL) 50 MG tablet TAKE 1 TABLET BY MOUTH AT  BEDTIME FOR HEADACHE PREVENTION. 90 tablet 1  . cholecalciferol (VITAMIN D3) 25 MCG (1000 UNIT) tablet Take 1,000 Units by mouth daily.    Marland Kitchen ibuprofen (ADVIL) 800 MG tablet Take 1 tablet (800 mg total) by mouth every 8 (eight) hours as needed. 30 tablet 0  . ketorolac (TORADOL) 10 MG tablet Take 1 tablet (10 mg total) by mouth every 8 (eight) hours as needed (migraines). 20 tablet 0  . Magnesium 100 MG CAPS Take by mouth.    . Multiple Vitamin (MULTIVITAMIN) tablet Take 1 tablet by mouth daily.    . Probiotic Product (PROBIOTIC-10 PO) Take by mouth.    . promethazine (PHENERGAN) 12.5 MG tablet Take 1-2 tablets (12.5-25 mg total) by mouth every 8 (eight) hours as needed for nausea or vomiting. 30 tablet 0  . tiZANidine (ZANAFLEX) 4 MG tablet Take 1-2 tablets by mouth twice daily as needed for migraines. Limit 1-2 treatments per week 30 tablet 0  . valACYclovir (VALTREX) 500 MG tablet TAKE ONE TABLET BY MOUTH EVERY 24 HOURS.  9   No current facility-administered medications on file prior to visit.    BP 120/62   Pulse (!) 119   Temp 98.6 F (37 C) (Temporal)   Ht 5\' 1"  (1.549 m)   Wt 162 lb (73.5 kg)   SpO2 97%   BMI 30.61 kg/m    Objective:   Physical Exam Cardiovascular:     Rate and Rhythm: Normal rate.  Pulmonary:     Effort: Pulmonary effort is normal.  Abdominal:     Tenderness: There is no right CVA tenderness or left CVA tenderness.  Musculoskeletal:     Cervical back: Neck supple.     Lumbar back: No spasms, tenderness or bony tenderness. Normal range of motion.       Back:  Skin:    General: Skin is warm and dry.            Assessment & Plan:

## 2019-12-17 LAB — URINE CULTURE
MICRO NUMBER:: 11263504
Result:: NO GROWTH
SPECIMEN QUALITY:: ADEQUATE

## 2020-01-24 ENCOUNTER — Other Ambulatory Visit: Payer: Self-pay | Admitting: Primary Care

## 2020-01-24 DIAGNOSIS — G44229 Chronic tension-type headache, not intractable: Secondary | ICD-10-CM

## 2020-01-24 DIAGNOSIS — G43C0 Periodic headache syndromes in child or adult, not intractable: Secondary | ICD-10-CM

## 2020-02-10 DIAGNOSIS — G44229 Chronic tension-type headache, not intractable: Secondary | ICD-10-CM

## 2020-02-12 MED ORDER — RIZATRIPTAN BENZOATE 10 MG PO TABS: ORAL_TABLET | ORAL | 0 refills | Status: DC

## 2020-02-22 DIAGNOSIS — Z01419 Encounter for gynecological examination (general) (routine) without abnormal findings: Secondary | ICD-10-CM | POA: Diagnosis not present

## 2020-02-22 DIAGNOSIS — Z113 Encounter for screening for infections with a predominantly sexual mode of transmission: Secondary | ICD-10-CM | POA: Diagnosis not present

## 2020-03-04 DIAGNOSIS — R519 Headache, unspecified: Secondary | ICD-10-CM | POA: Diagnosis not present

## 2020-03-04 DIAGNOSIS — M9902 Segmental and somatic dysfunction of thoracic region: Secondary | ICD-10-CM | POA: Diagnosis not present

## 2020-03-04 DIAGNOSIS — M531 Cervicobrachial syndrome: Secondary | ICD-10-CM | POA: Diagnosis not present

## 2020-03-04 DIAGNOSIS — M9901 Segmental and somatic dysfunction of cervical region: Secondary | ICD-10-CM | POA: Diagnosis not present

## 2020-03-09 DIAGNOSIS — R519 Headache, unspecified: Secondary | ICD-10-CM | POA: Diagnosis not present

## 2020-03-09 DIAGNOSIS — M9901 Segmental and somatic dysfunction of cervical region: Secondary | ICD-10-CM | POA: Diagnosis not present

## 2020-03-09 DIAGNOSIS — M9902 Segmental and somatic dysfunction of thoracic region: Secondary | ICD-10-CM | POA: Diagnosis not present

## 2020-03-09 DIAGNOSIS — M531 Cervicobrachial syndrome: Secondary | ICD-10-CM | POA: Diagnosis not present

## 2020-03-23 DIAGNOSIS — M531 Cervicobrachial syndrome: Secondary | ICD-10-CM | POA: Diagnosis not present

## 2020-03-23 DIAGNOSIS — M9902 Segmental and somatic dysfunction of thoracic region: Secondary | ICD-10-CM | POA: Diagnosis not present

## 2020-03-23 DIAGNOSIS — R519 Headache, unspecified: Secondary | ICD-10-CM | POA: Diagnosis not present

## 2020-03-23 DIAGNOSIS — M9901 Segmental and somatic dysfunction of cervical region: Secondary | ICD-10-CM | POA: Diagnosis not present

## 2020-03-24 ENCOUNTER — Ambulatory Visit: Payer: BC Managed Care – PPO | Admitting: Primary Care

## 2020-03-30 ENCOUNTER — Encounter: Payer: Self-pay | Admitting: Primary Care

## 2020-03-30 ENCOUNTER — Ambulatory Visit: Payer: BC Managed Care – PPO | Admitting: Primary Care

## 2020-03-30 ENCOUNTER — Other Ambulatory Visit: Payer: Self-pay

## 2020-03-30 DIAGNOSIS — G44229 Chronic tension-type headache, not intractable: Secondary | ICD-10-CM | POA: Diagnosis not present

## 2020-03-30 DIAGNOSIS — G43C0 Periodic headache syndromes in child or adult, not intractable: Secondary | ICD-10-CM | POA: Diagnosis not present

## 2020-03-30 NOTE — Progress Notes (Signed)
Subjective:    Patient ID: Danielle Saunders, female    DOB: 10/27/1980, 40 y.o.   MRN: [id number]  HPI  Danielle Saunders is a very pleasant 40 y.o. female with a history of migraines and frequent headaches who presents today for follow up of migraines.  Currently managed on amitriptyline 50 mg at bedtime for migraine prevention and rizatriptan 10 mg as needed for migraine abortion.  Overall she feels improved, her last migraine was February 28th which is less frequent than before.  Previously she was experiencing migraines twice weekly.  She is compliant to amitriptyline 50 mg at bedtime, does not experience second day drowsiness.  She is also compliant to rizatriptan 10 mg, this is effective for migraine abortion.  She is currently not seeing neurology, was following with a headache center but found treatments ineffective.  She would like to continue with her current regimen.   Review of Systems  Eyes: Negative for photophobia and visual disturbance.  Neurological: Negative for dizziness.       See HPI         Past Medical History:  Diagnosis Date  . Chronic tension headaches   . DEPRESSIVE DISORDER, NOS 03/14/2006   Qualifier: Diagnosis of  By: Samara Snide    . Genital herpes   . PONV (postoperative nausea and vomiting)     Social History   Socioeconomic History  . Marital status: Married    Spouse name: Not on file  . Number of children: Not on file  . Years of education: Not on file  . Highest education level: Not on file  Occupational History  . Not on file  Tobacco Use  . Smoking status: Never Smoker  . Smokeless tobacco: Never Used  Substance and Sexual Activity  . Alcohol use: No    Alcohol/week: 0.0 standard drinks  . Drug use: Not on file  . Sexual activity: Not on file  Other Topics Concern  . Not on file  Social History Narrative   Married.   2 children.   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    Enjoys relaxing and spending time with her family.    Social  Determinants of Health   Financial Resource Strain: Not on file  Food Insecurity: Not on file  Transportation Needs: Not on file  Physical Activity: Not on file  Stress: Not on file  Social Connections: Not on file  Intimate Partner Violence: Not on file    Past Surgical History:  Procedure Laterality Date  . ABDOMINOPLASTY    . BREAST ENHANCEMENT SURGERY    . IUD REMOVAL N/A 02/11/2019   Procedure: INTRAUTERINE DEVICE (IUD) REMOVAL;  Surgeon: Christophe Louis, MD;  Location: [place];  Service: Gynecology;  Laterality: N/A;  . LAPAROSCOPIC TUBAL LIGATION Bilateral 02/11/2019   Procedure: LAPAROSCOPIC TUBAL LIGATION;  Surgeon: Christophe Louis, MD;  Location: [place];  Service: Gynecology;  Laterality: Bilateral;    Family History  Problem Relation Age of Onset  . Hyperlipidemia Father     No Known Allergies  Current Outpatient Medications on File Prior to Visit  Medication Sig Dispense Refill  . amitriptyline (ELAVIL) 50 MG tablet TAKE 1 TABLET BY MOUTH AT BEDTIME FOR HEADACHE PREVENTION. 90 tablet 1  . cholecalciferol (VITAMIN D3) 25 MCG (1000 UNIT) tablet Take 1,000 Units by mouth daily.    Marland Kitchen ibuprofen (ADVIL) 800 MG tablet Take 1 tablet (800 mg total) by mouth every 8 (eight) hours as needed. 30 tablet  0  . ketorolac (TORADOL) 10 MG tablet Take 1 tablet (10 mg total) by mouth every 8 (eight) hours as needed (migraines). 20 tablet 0  . Magnesium 100 MG CAPS Take by mouth.    . Multiple Vitamin (MULTIVITAMIN) tablet Take 1 tablet by mouth daily.    . Probiotic Product (PROBIOTIC-10 PO) Take by mouth.    . promethazine (PHENERGAN) 12.5 MG tablet Take 1-2 tablets (12.5-25 mg total) by mouth every 8 (eight) hours as needed for nausea or vomiting. 30 tablet 0  . rizatriptan (MAXALT) 10 MG tablet Take 1 tablet by mouth at migraine onset.  May repeat in 2 hours if needed.  Do not exceed 2 tablets in 24 hours. 10 tablet 0  . tiZANidine (ZANAFLEX) 4 MG tablet  Take 1-2 tablets by mouth twice daily as needed for migraines. Limit 1-2 treatments per week 30 tablet 0  . valACYclovir (VALTREX) 500 MG tablet TAKE ONE TABLET BY MOUTH EVERY 24 HOURS.  9   No current facility-administered medications on file prior to visit.    BP 118/74   Pulse 84   Temp 97.9 F (36.6 C) (Temporal)   Ht 5\' 1"  (1.549 m)   Wt 156 lb (70.8 kg)   BMI 29.48 kg/m  Objective:   Physical Exam Constitutional:      General: She is not in acute distress. Pulmonary:     Effort: Pulmonary effort is normal.  Neurological:     Mental Status: She is alert.  Psychiatric:        Mood and Affect: Mood normal.           Assessment & Plan:      This visit occurred during the SARS-CoV-2 public health emergency.  Safety protocols were in place, including screening questions prior to the visit, additional usage of staff PPE, and extensive cleaning of exam room while observing appropriate contact time as indicated for disinfecting solutions.

## 2020-03-30 NOTE — Assessment & Plan Note (Signed)
Improved on amitriptyline 50 mg at bedtime, continue same.  Continue rizatriptan 10 mg as needed for migraine abortion.

## 2020-03-30 NOTE — Assessment & Plan Note (Signed)
Less frequent and overall improved on amitriptyline 50 mg at bedtime.  Continue same.  Continue rizatriptan 10 mg as needed for migraine abortion.

## 2020-04-06 DIAGNOSIS — M9902 Segmental and somatic dysfunction of thoracic region: Secondary | ICD-10-CM | POA: Diagnosis not present

## 2020-04-06 DIAGNOSIS — M9901 Segmental and somatic dysfunction of cervical region: Secondary | ICD-10-CM | POA: Diagnosis not present

## 2020-04-06 DIAGNOSIS — R519 Headache, unspecified: Secondary | ICD-10-CM | POA: Diagnosis not present

## 2020-04-06 DIAGNOSIS — M531 Cervicobrachial syndrome: Secondary | ICD-10-CM | POA: Diagnosis not present

## 2020-04-07 ENCOUNTER — Telehealth: Payer: Self-pay | Admitting: Primary Care

## 2020-04-07 DIAGNOSIS — R110 Nausea: Secondary | ICD-10-CM | POA: Diagnosis not present

## 2020-04-07 DIAGNOSIS — G43009 Migraine without aura, not intractable, without status migrainosus: Secondary | ICD-10-CM | POA: Diagnosis not present

## 2020-04-07 NOTE — Telephone Encounter (Signed)
Patient is stating that she is having a horrible migraine and that her prescription meds are not working. She is asking for a Shot for her headache. We have no openings please advise. EM

## 2020-04-07 NOTE — Telephone Encounter (Signed)
Called patient she has not taken two. Per Anda Kraft told to take one now and 2 hours later take second. If no improvement we will see her at 320 tomorrow. If she has improvement she will call and cancel.

## 2020-04-07 NOTE — Telephone Encounter (Signed)
Did she take her rizatriptan (Maxalt)?  If so, did she take a second tablet, if not then I recommend she do so.  If she has taken a second dose of her rizatriptan and her migraine has persisted then I can add her on at 3:40 today.  I can only address this 1 issue, just FYI.

## 2020-04-08 ENCOUNTER — Ambulatory Visit: Payer: BC Managed Care – PPO | Admitting: Primary Care

## 2020-04-08 DIAGNOSIS — Z0289 Encounter for other administrative examinations: Secondary | ICD-10-CM

## 2020-04-27 DIAGNOSIS — M9901 Segmental and somatic dysfunction of cervical region: Secondary | ICD-10-CM | POA: Diagnosis not present

## 2020-04-27 DIAGNOSIS — M9902 Segmental and somatic dysfunction of thoracic region: Secondary | ICD-10-CM | POA: Diagnosis not present

## 2020-04-27 DIAGNOSIS — M531 Cervicobrachial syndrome: Secondary | ICD-10-CM | POA: Diagnosis not present

## 2020-04-27 DIAGNOSIS — R519 Headache, unspecified: Secondary | ICD-10-CM | POA: Diagnosis not present

## 2020-05-11 DIAGNOSIS — G44229 Chronic tension-type headache, not intractable: Secondary | ICD-10-CM

## 2020-05-11 DIAGNOSIS — G43001 Migraine without aura, not intractable, with status migrainosus: Secondary | ICD-10-CM

## 2020-05-11 DIAGNOSIS — G43C0 Periodic headache syndromes in child or adult, not intractable: Secondary | ICD-10-CM

## 2020-05-25 DIAGNOSIS — M9901 Segmental and somatic dysfunction of cervical region: Secondary | ICD-10-CM | POA: Diagnosis not present

## 2020-05-25 DIAGNOSIS — R519 Headache, unspecified: Secondary | ICD-10-CM | POA: Diagnosis not present

## 2020-05-25 DIAGNOSIS — M9902 Segmental and somatic dysfunction of thoracic region: Secondary | ICD-10-CM | POA: Diagnosis not present

## 2020-05-25 DIAGNOSIS — M531 Cervicobrachial syndrome: Secondary | ICD-10-CM | POA: Diagnosis not present

## 2020-06-07 ENCOUNTER — Telehealth: Payer: Self-pay

## 2020-06-07 NOTE — Telephone Encounter (Signed)
Patient called in regards to the no show fee she received for 04/08/20. She did not realize she had an appointment on that day. Can that be a no charge? Patient called billing department but they told her to call us also to get an ok. Please let patient know.

## 2020-06-07 NOTE — Telephone Encounter (Signed)
Oh no! Please do not charge her the no show fee! Please tell her that I don't charge that.

## 2020-06-07 NOTE — Telephone Encounter (Signed)
Patient advised. Sending to Donzetta Matters for review and cancelling this fee. Please let patient know if she needs to do anything else for this. Thank you

## 2020-06-08 NOTE — Telephone Encounter (Signed)
Sent to charge correction and it has been removed. Patient aware

## 2020-06-11 ENCOUNTER — Other Ambulatory Visit: Payer: Self-pay | Admitting: Primary Care

## 2020-06-11 DIAGNOSIS — G43C0 Periodic headache syndromes in child or adult, not intractable: Secondary | ICD-10-CM

## 2020-06-11 DIAGNOSIS — G44229 Chronic tension-type headache, not intractable: Secondary | ICD-10-CM

## 2020-06-22 DIAGNOSIS — M531 Cervicobrachial syndrome: Secondary | ICD-10-CM | POA: Diagnosis not present

## 2020-06-22 DIAGNOSIS — R519 Headache, unspecified: Secondary | ICD-10-CM | POA: Diagnosis not present

## 2020-06-22 DIAGNOSIS — M9902 Segmental and somatic dysfunction of thoracic region: Secondary | ICD-10-CM | POA: Diagnosis not present

## 2020-06-22 DIAGNOSIS — M9901 Segmental and somatic dysfunction of cervical region: Secondary | ICD-10-CM | POA: Diagnosis not present

## 2020-08-08 ENCOUNTER — Ambulatory Visit: Payer: BC Managed Care – PPO | Admitting: Neurology

## 2020-09-14 DIAGNOSIS — M9902 Segmental and somatic dysfunction of thoracic region: Secondary | ICD-10-CM | POA: Diagnosis not present

## 2020-09-14 DIAGNOSIS — R519 Headache, unspecified: Secondary | ICD-10-CM | POA: Diagnosis not present

## 2020-09-14 DIAGNOSIS — M531 Cervicobrachial syndrome: Secondary | ICD-10-CM | POA: Diagnosis not present

## 2020-09-14 DIAGNOSIS — M9901 Segmental and somatic dysfunction of cervical region: Secondary | ICD-10-CM | POA: Diagnosis not present

## 2020-10-06 ENCOUNTER — Other Ambulatory Visit: Payer: Self-pay | Admitting: Primary Care

## 2020-10-06 DIAGNOSIS — G43C0 Periodic headache syndromes in child or adult, not intractable: Secondary | ICD-10-CM

## 2020-10-06 DIAGNOSIS — G44229 Chronic tension-type headache, not intractable: Secondary | ICD-10-CM

## 2020-10-17 ENCOUNTER — Telehealth: Payer: Self-pay | Admitting: Primary Care

## 2020-10-17 NOTE — Telephone Encounter (Signed)
Pt called in stated Anda Kraft agreed to take her son as a new Patient . Please advise [phone]

## 2020-10-17 NOTE — Telephone Encounter (Signed)
Ok to make appointment for patient son?

## 2020-10-18 NOTE — Telephone Encounter (Signed)
Yes, just need to double check that he is 39 years of age or older.

## 2020-10-18 NOTE — Telephone Encounter (Signed)
See Clearence Cheek answer below

## 2020-10-23 DIAGNOSIS — G43111 Migraine with aura, intractable, with status migrainosus: Secondary | ICD-10-CM | POA: Diagnosis not present

## 2020-10-23 DIAGNOSIS — R112 Nausea with vomiting, unspecified: Secondary | ICD-10-CM | POA: Diagnosis not present

## 2020-10-31 ENCOUNTER — Encounter: Payer: Self-pay | Admitting: Neurology

## 2020-10-31 ENCOUNTER — Ambulatory Visit: Payer: BC Managed Care – PPO | Admitting: Neurology

## 2020-10-31 ENCOUNTER — Telehealth: Payer: Self-pay | Admitting: *Deleted

## 2020-10-31 ENCOUNTER — Other Ambulatory Visit: Payer: Self-pay

## 2020-10-31 VITALS — BP 124/86 | HR 105 | Ht 61.0 in | Wt 171.4 lb

## 2020-10-31 DIAGNOSIS — G43709 Chronic migraine without aura, not intractable, without status migrainosus: Secondary | ICD-10-CM

## 2020-10-31 MED ORDER — EMGALITY 120 MG/ML ~~LOC~~ SOAJ: 120.0000 mg | SUBCUTANEOUS | 11 refills | Status: DC

## 2020-10-31 MED ORDER — PROMETHAZINE HCL 12.5 MG PO TABS: 12.5000 mg | ORAL_TABLET | Freq: Three times a day (TID) | ORAL | 11 refills | Status: DC | PRN

## 2020-10-31 MED ORDER — EMGALITY 120 MG/ML ~~LOC~~ SOAJ: 120.0000 mg | SUBCUTANEOUS | 0 refills | Status: DC

## 2020-10-31 MED ORDER — RIZATRIPTAN BENZOATE 10 MG PO TABS: ORAL_TABLET | ORAL | 11 refills | Status: DC

## 2020-10-31 NOTE — Progress Notes (Signed)
[facility]    Provider:  Dr Jaynee Eagles Requesting Provider: Pleas Koch, NP Primary Care Provider:  Pleas Koch, NP  CC:  Migraines  HPI:  Danielle Saunders is a 40 y.o. female here as requested by Pleas Koch, NP for migraines. She has had migraines since a teenage, worsening in the last 9 years. Usually left side, radiates to the back of the head and neck, pulsating/pounding/throbbing, nausea and vomiting, photo/phonophobia, movement makes it worse. She has 20 migraine days a month that are moderate to severe and other headaches over 20 daily . She had tubal ligation not planning on children. Not positional, not exertional, she has been to the headache clinic, food diaries did not hekp, amitriptyline helps a little bit but having side effects. No new symptoms. Had normal CT in 2017. No medication overuse. 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 No other focal neurologic deficits, associated symptoms, inciting events or modifiable factors.  Reviewed notes, labs and imaging from outside physicians, which showed:   06/30/2019: normal cbc and cmp  From a thorough review of records, meds tried that can be used in migraine management include: Amitriptyline, baclofen, Decadron injections, ketorolac tablets and injections, ibuprofen, magnesium, Zofran tablets and injections, promethazine tablets and injections, sumatriptan, rizatriptan, tizanidine, Topamax, zolmitriptan, verapamil, propranolol contraindicated due to low blood pressure(she hs white coat syndrome BP is higher in the office usually a lot lower), zonegran  10/12/2015:   CT Head showed No acute intracranial abnormalities including mass lesion or mass effect, hydrocephalus, extra-axial fluid collection, midline shift, hemorrhage, or acute infarction, large ischemic events (personally reviewed images)    Medications tried that can be used in migraine management include:   Review of Systems: Patient complains of symptoms  per HPI as well as the following symptoms weight gain. Pertinent negatives and positives per HPI. All others negative.   Social History   Socioeconomic History   Marital status: Married    Spouse name: Not on file   Number of children: Not on file   Years of education: Not on file   Highest education level: Not on file  Occupational History   Not on file  Tobacco Use   Smoking status: Never   Smokeless tobacco: Never  Vaping Use   Vaping Use: Never used  Substance and Sexual Activity   Alcohol use: No    Alcohol/week: 0.0 standard drinks   Drug use: Not on file   Sexual activity: Not on file    Comment: Tuberligation  Other Topics Concern   Not on file  Social History Narrative   Married.   2 children.   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    Enjoys relaxing and spending time with her family.    Social Determinants of Health   Financial Resource Strain: Not on file  Food Insecurity: Not on file  Transportation Needs: Not on file  Physical Activity: Not on file  Stress: Not on file  Social Connections: Not on file  Intimate Partner Violence: Not on file    Family History  Problem Relation Age of Onset   Hyperlipidemia Father    Migraines Paternal Aunt     Past Medical History:  Diagnosis Date   Chronic tension headaches    DEPRESSIVE DISORDER, NOS 03/14/2006   Qualifier: Diagnosis of  By: Samara Snide     Genital herpes    PONV (postoperative nausea and vomiting)     Patient Active Problem List   Diagnosis Date Noted  Flank pain 12/16/2019   Genital herpes 06/30/2019   Migraine 05/30/2017   Preventative health care 02/18/2015   Chronic tension-type headache, not intractable 10/27/2014   CERVICAL DYSPLASIA 03/14/2006    Past Surgical History:  Procedure Laterality Date   ABDOMINOPLASTY     BREAST ENHANCEMENT SURGERY     IUD REMOVAL N/A 02/11/2019   Procedure: INTRAUTERINE DEVICE (IUD) REMOVAL;  Surgeon: Christophe Louis, MD;  Location: [place];  Service: Gynecology;  Laterality: N/A;   LAPAROSCOPIC TUBAL LIGATION Bilateral 02/11/2019   Procedure: LAPAROSCOPIC TUBAL LIGATION;  Surgeon: Christophe Louis, MD;  Location: [place];  Service: Gynecology;  Laterality: Bilateral;    Current Outpatient Medications  Medication Sig Dispense Refill   amitriptyline (ELAVIL) 50 MG tablet TAKE 1 TABLET BY MOUTH AT BEDTIME FOR HEADACHE PREVENTION. 90 tablet 1   cholecalciferol (VITAMIN D3) 25 MCG (1000 UNIT) tablet Take 1,000 Units by mouth daily.     Galcanezumab-gnlm (EMGALITY) 120 MG/ML SOAJ Inject 120 mg into the skin every 30 (thirty) days. 1.12 mL 11   Galcanezumab-gnlm (EMGALITY) 120 MG/ML SOAJ Inject 120 mg into the skin every 30 (thirty) days. 4 mL 0   ibuprofen (ADVIL) 800 MG tablet Take 1 tablet (800 mg total) by mouth every 8 (eight) hours as needed. 30 tablet 0   ketorolac (TORADOL) 10 MG tablet Take 1 tablet (10 mg total) by mouth every 8 (eight) hours as needed (migraines). 20 tablet 0   Magnesium 100 MG CAPS Take by mouth.     Multiple Vitamin (MULTIVITAMIN) tablet Take 1 tablet by mouth daily.     Probiotic Product (PROBIOTIC-10 PO) Take by mouth.     tiZANidine (ZANAFLEX) 4 MG tablet Take 1-2 tablets by mouth twice daily as needed for migraines. Limit 1-2 treatments per week 30 tablet 0   valACYclovir (VALTREX) 500 MG tablet TAKE ONE TABLET BY MOUTH EVERY 24 HOURS.  9   promethazine (PHENERGAN) 12.5 MG tablet Take 1-2 tablets (12.5-25 mg total) by mouth every 8 (eight) hours as needed for nausea or vomiting. 30 tablet 11   rizatriptan (MAXALT) 10 MG tablet Take 1 tablet by mouth at migraine onset.  May repeat in 2 hours if needed.  Do not exceed 2 tablets in 24 hours. 10 tablet 11   No current facility-administered medications for this visit.    Allergies as of 10/31/2020   (No Known Allergies)    Vitals: BP 124/86   Pulse (!) 105   Ht 5\' 1"  (1.549 m)   Wt 171 lb 6.4 oz (77.7 kg)   BMI 32.39 kg/m  Last  Weight:  Wt Readings from Last 1 Encounters:  10/31/20 171 lb 6.4 oz (77.7 kg)   Last Height:   Ht Readings from Last 1 Encounters:  10/31/20 5\' 1"  (1.549 m)     Physical exam: Exam: Gen: NAD, conversant, well nourised, obese, well groomed                     CV: RRR, no MRG. No Carotid Bruits. No peripheral edema, warm, nontender Eyes: Conjunctivae clear without exudates or hemorrhage  Neuro: Detailed Neurologic Exam  Speech:    Speech is normal; fluent and spontaneous with normal comprehension.  Cognition:    The patient is oriented to person, place, and time;     recent and remote memory intact;     language fluent;     normal attention, concentration,     fund of knowledge  Cranial Nerves:    The pupils are equal, round, and reactive to light. The fundi are flat. Visual fields are full to finger confrontation. Extraocular movements are intact. Trigeminal sensation is intact and the muscles of mastication are normal. The face is symmetric. The palate elevates in the midline. Hearing intact. Voice is normal. Shoulder shrug is normal. The tongue has normal motion without fasciculations.   Coordination:    Normal .   Gait:    normal.   Motor Observation:    No asymmetry, no atrophy, and no involuntary movements noted. Tone:    Normal muscle tone.    Posture:    Posture is normal. normal erect    Strength:    Strength is V/V in the upper and lower limbs.      Sensation: intact to LT     Reflex Exam:  DTR's:    Deep tendon reflexes in the upper and lower extremities are symmetrical bilaterally.   Toes:    The toes are downgoing bilaterally.   Clonus:    Clonus is absent.    Assessment/Plan:  Patient with chronic migraines. No red flags to indicate needed imaging but if does not improve consider MRI brain. She is not having children, had tubal ligation, do not get pregnant on emgality until stopping for 6 months discussed, also discussed increased risk of stroke  in migraine with aura.   Prevention: Today started Emgality. Every month one injection Acute: Maxalt and phenergen. If doesn't work consider Biomedical scientist When starting to feel much better go down 1/2 amitriptyline 4 month virtual, in the meantime if anything happens or feeling better but acute management is not working sens me a Therapist, music F/u 4 months can push out if feeling well  Orders Placed This Encounter  Procedures   TSH   CBC with Differential/Platelets   Comprehensive metabolic panel    Meds ordered this encounter  Medications   rizatriptan (MAXALT) 10 MG tablet    Sig: Take 1 tablet by mouth at migraine onset.  May repeat in 2 hours if needed.  Do not exceed 2 tablets in 24 hours.    Dispense:  10 tablet    Refill:  11   promethazine (PHENERGAN) 12.5 MG tablet    Sig: Take 1-2 tablets (12.5-25 mg total) by mouth every 8 (eight) hours as needed for nausea or vomiting.    Dispense:  30 tablet    Refill:  11   Galcanezumab-gnlm (EMGALITY) 120 MG/ML SOAJ    Sig: Inject 120 mg into the skin every 30 (thirty) days.    Dispense:  1.12 mL    Refill:  11   Galcanezumab-gnlm (EMGALITY) 120 MG/ML SOAJ    Sig: Inject 120 mg into the skin every 30 (thirty) days.    Dispense:  4 mL    Refill:  0     Cc: Pleas Koch, NP,  Pleas Koch, NP  Sarina Ill, MD  [facility] [address] [place], [place] [address]  Phone [phone] Fax [phone]

## 2020-10-31 NOTE — Telephone Encounter (Signed)
Danielle Saunders - Rx #: [id number]    PA sent Waiting on approval for [facility]

## 2020-10-31 NOTE — Patient Instructions (Addendum)
Prevention: Today started Emgality. Every month one injection Acute: Maxalt and phenergen. If doesn't work consider Biomedical scientist When starting to feel much better go down 1/2 amitriptyline 4 month virtual, in the meantime if anything happens or feeling better but acute management is not working sens me a Therapist, music F/u 4 months can push out if feeling well  Galcanezumab injection What is this medication? GALCANEZUMAB (gal ka NEZ ue mab) is used to prevent migraines and treat cluster headaches. This medicine may be used for other purposes; ask your health care provider or pharmacist if you have questions. COMMON BRAND NAME(S): Emgality What should I tell my care team before I take this medication? They need to know if you have any of these conditions: an unusual or allergic reaction to galcanezumab, other medicines, foods, dyes, or preservatives pregnant or trying to get pregnant breast-feeding How should I use this medication? This medicine is for injection under the skin. You will be taught how to prepare and give this medicine. Use exactly as directed. Take your medicine at regular intervals. Do not take your medicine more often than directed. It is important that you put your used needles and syringes in a special sharps container. Do not put them in a trash can. If you do not have a sharps container, call your pharmacist or healthcare provider to get one. Talk to your pediatrician regarding the use of this medicine in children. Special care may be needed. Overdosage: If you think you have taken too much of this medicine contact a poison control center or emergency room at once. NOTE: This medicine is only for you. Do not share this medicine with others. What if I miss a dose? If you miss a dose, take it as soon as you can. If it is almost time for your next dose, take only that dose. Do not take double or extra doses. What may interact with this medication? Interactions are not  expected. This list may not describe all possible interactions. Give your health care provider a list of all the medicines, herbs, non-prescription drugs, or dietary supplements you use. Also tell them if you smoke, drink alcohol, or use illegal drugs. Some items may interact with your medicine. What should I watch for while using this medication? Tell your doctor or healthcare professional if your symptoms do not start to get better or if they get worse. What side effects may I notice from receiving this medication? Side effects that you should report to your doctor or health care professional as soon as possible: allergic reactions like skin rash, itching or hives, swelling of the face, lips, or tongue Side effects that usually do not require medical attention (report these to your doctor or health care professional if they continue or are bothersome): pain, redness, or irritation at site where injected This list may not describe all possible side effects. Call your doctor for medical advice about side effects. You may report side effects to FDA at 1-800-FDA-1088. Where should I keep my medication? Keep out of the reach of children. You will be instructed on how to store this medicine. Throw away any unused medicine after the expiration date on the label. NOTE: This sheet is a summary. It may not cover all possible information. If you have questions about this medicine, talk to your doctor, pharmacist, or health care provider.  2022 Elsevier/Gold Standard (2017-06-19 12:03:23)

## 2020-11-01 LAB — CBC WITH DIFFERENTIAL/PLATELET
Basophils Absolute: 0.1 10*3/uL (ref 0.0–0.2)
Basos: 1 %
EOS (ABSOLUTE): 0.2 10*3/uL (ref 0.0–0.4)
Eos: 3 %
Hematocrit: 40.5 % (ref 34.0–46.6)
Hemoglobin: 13.6 g/dL (ref 11.1–15.9)
Immature Grans (Abs): 0 10*3/uL (ref 0.0–0.1)
Immature Granulocytes: 0 %
Lymphocytes Absolute: 2.3 10*3/uL (ref 0.7–3.1)
Lymphs: 33 %
MCH: 28.8 pg (ref 26.6–33.0)
MCHC: 33.6 g/dL (ref 31.5–35.7)
MCV: 86 fL (ref 79–97)
Monocytes Absolute: 0.4 10*3/uL (ref 0.1–0.9)
Monocytes: 6 %
Neutrophils Absolute: 4 10*3/uL (ref 1.4–7.0)
Neutrophils: 57 %
Platelets: 315 10*3/uL (ref 150–450)
RBC: 4.72 x10E6/uL (ref 3.77–5.28)
RDW: 13.6 % (ref 11.7–15.4)
WBC: 6.9 10*3/uL (ref 3.4–10.8)

## 2020-11-01 LAB — COMPREHENSIVE METABOLIC PANEL
ALT: 12 IU/L (ref 0–32)
AST: 13 IU/L (ref 0–40)
Albumin/Globulin Ratio: 1.8 (ref 1.2–2.2)
Albumin: 4.1 g/dL (ref 3.8–4.8)
Alkaline Phosphatase: 83 IU/L (ref 44–121)
BUN/Creatinine Ratio: 10 (ref 9–23)
BUN: 8 mg/dL (ref 6–20)
Bilirubin Total: 0.3 mg/dL (ref 0.0–1.2)
CO2: 21 mmol/L (ref 20–29)
Calcium: 9.2 mg/dL (ref 8.7–10.2)
Chloride: 104 mmol/L (ref 96–106)
Creatinine, Ser: 0.84 mg/dL (ref 0.57–1.00)
Globulin, Total: 2.3 g/dL (ref 1.5–4.5)
Glucose: 85 mg/dL (ref 70–99)
Potassium: 4.2 mmol/L (ref 3.5–5.2)
Sodium: 140 mmol/L (ref 134–144)
Total Protein: 6.4 g/dL (ref 6.0–8.5)
eGFR: 91 mL/min/{1.73_m2} (ref >59)

## 2020-11-01 LAB — TSH: TSH: 2.66 u[IU]/mL (ref 0.450–4.500)

## 2020-11-02 NOTE — Telephone Encounter (Signed)
Approvedtoday Effective from 10/31/2020 through 01/22/18  PA approved for [employer]. Will contact patient through [employer] to make the patient aware of approval

## 2020-11-20 DIAGNOSIS — R051 Acute cough: Secondary | ICD-10-CM | POA: Diagnosis not present

## 2020-11-20 DIAGNOSIS — H1033 Unspecified acute conjunctivitis, bilateral: Secondary | ICD-10-CM | POA: Diagnosis not present

## 2020-11-20 DIAGNOSIS — R0981 Nasal congestion: Secondary | ICD-10-CM | POA: Diagnosis not present

## 2020-11-20 DIAGNOSIS — Z03818 Encounter for observation for suspected exposure to other biological agents ruled out: Secondary | ICD-10-CM | POA: Diagnosis not present

## 2021-01-10 ENCOUNTER — Telehealth: Payer: Self-pay

## 2021-01-10 NOTE — Telephone Encounter (Signed)
Approved, Effective from 01/10/2021 through 01/09/2022.

## 2021-01-10 NOTE — Telephone Encounter (Signed)
I have submitted a PA for Emgality on CMM, Key: [id number]. Awaiting determination from [place].

## 2021-02-23 ENCOUNTER — Encounter: Payer: Self-pay | Admitting: Neurology

## 2021-02-27 ENCOUNTER — Telehealth: Payer: BC Managed Care – PPO | Admitting: Neurology

## 2021-03-23 DIAGNOSIS — Z01419 Encounter for gynecological examination (general) (routine) without abnormal findings: Secondary | ICD-10-CM | POA: Diagnosis not present

## 2021-05-03 ENCOUNTER — Other Ambulatory Visit: Payer: Self-pay | Admitting: Cardiology

## 2021-05-03 ENCOUNTER — Other Ambulatory Visit: Payer: Self-pay | Admitting: Obstetrics and Gynecology

## 2021-05-03 ENCOUNTER — Other Ambulatory Visit: Payer: Self-pay | Admitting: Primary Care

## 2021-05-03 DIAGNOSIS — Z1231 Encounter for screening mammogram for malignant neoplasm of breast: Secondary | ICD-10-CM

## 2021-06-01 ENCOUNTER — Encounter: Payer: BC Managed Care – PPO | Admitting: Primary Care

## 2021-06-23 ENCOUNTER — Encounter: Payer: BC Managed Care – PPO | Admitting: Primary Care

## 2021-07-06 DIAGNOSIS — M9902 Segmental and somatic dysfunction of thoracic region: Secondary | ICD-10-CM | POA: Diagnosis not present

## 2021-07-06 DIAGNOSIS — M9901 Segmental and somatic dysfunction of cervical region: Secondary | ICD-10-CM | POA: Diagnosis not present

## 2021-07-06 DIAGNOSIS — M9908 Segmental and somatic dysfunction of rib cage: Secondary | ICD-10-CM | POA: Diagnosis not present

## 2021-07-06 DIAGNOSIS — M531 Cervicobrachial syndrome: Secondary | ICD-10-CM | POA: Diagnosis not present

## 2021-07-20 DIAGNOSIS — M9902 Segmental and somatic dysfunction of thoracic region: Secondary | ICD-10-CM | POA: Diagnosis not present

## 2021-07-20 DIAGNOSIS — M9908 Segmental and somatic dysfunction of rib cage: Secondary | ICD-10-CM | POA: Diagnosis not present

## 2021-07-20 DIAGNOSIS — M531 Cervicobrachial syndrome: Secondary | ICD-10-CM | POA: Diagnosis not present

## 2021-07-20 DIAGNOSIS — M9901 Segmental and somatic dysfunction of cervical region: Secondary | ICD-10-CM | POA: Diagnosis not present

## 2021-07-26 ENCOUNTER — Encounter: Payer: BC Managed Care – PPO | Admitting: Primary Care

## 2021-08-02 DIAGNOSIS — D485 Neoplasm of uncertain behavior of skin: Secondary | ICD-10-CM | POA: Diagnosis not present

## 2021-08-02 DIAGNOSIS — D225 Melanocytic nevi of trunk: Secondary | ICD-10-CM | POA: Diagnosis not present

## 2021-08-02 DIAGNOSIS — L821 Other seborrheic keratosis: Secondary | ICD-10-CM | POA: Diagnosis not present

## 2021-08-02 DIAGNOSIS — R208 Other disturbances of skin sensation: Secondary | ICD-10-CM | POA: Diagnosis not present

## 2021-08-02 DIAGNOSIS — L538 Other specified erythematous conditions: Secondary | ICD-10-CM | POA: Diagnosis not present

## 2021-08-02 DIAGNOSIS — L820 Inflamed seborrheic keratosis: Secondary | ICD-10-CM | POA: Diagnosis not present

## 2021-08-02 DIAGNOSIS — L814 Other melanin hyperpigmentation: Secondary | ICD-10-CM | POA: Diagnosis not present

## 2021-08-16 DIAGNOSIS — M9901 Segmental and somatic dysfunction of cervical region: Secondary | ICD-10-CM | POA: Diagnosis not present

## 2021-08-16 DIAGNOSIS — M9908 Segmental and somatic dysfunction of rib cage: Secondary | ICD-10-CM | POA: Diagnosis not present

## 2021-08-16 DIAGNOSIS — M531 Cervicobrachial syndrome: Secondary | ICD-10-CM | POA: Diagnosis not present

## 2021-08-16 DIAGNOSIS — M9902 Segmental and somatic dysfunction of thoracic region: Secondary | ICD-10-CM | POA: Diagnosis not present

## 2021-08-23 ENCOUNTER — Encounter: Payer: BC Managed Care – PPO | Admitting: Primary Care

## 2021-08-28 ENCOUNTER — Ambulatory Visit: Payer: BC Managed Care – PPO

## 2021-09-13 DIAGNOSIS — M9908 Segmental and somatic dysfunction of rib cage: Secondary | ICD-10-CM | POA: Diagnosis not present

## 2021-09-13 DIAGNOSIS — M531 Cervicobrachial syndrome: Secondary | ICD-10-CM | POA: Diagnosis not present

## 2021-09-13 DIAGNOSIS — M9902 Segmental and somatic dysfunction of thoracic region: Secondary | ICD-10-CM | POA: Diagnosis not present

## 2021-09-13 DIAGNOSIS — M9901 Segmental and somatic dysfunction of cervical region: Secondary | ICD-10-CM | POA: Diagnosis not present

## 2021-09-21 ENCOUNTER — Ambulatory Visit
Admission: RE | Admit: 2021-09-21 | Discharge: 2021-09-21 | Disposition: A | Discharge status: Home / Self Care | Payer: BC Managed Care – PPO | Source: Ambulatory Visit | Attending: Obstetrics and Gynecology | Admitting: Obstetrics and Gynecology

## 2021-09-21 ENCOUNTER — Other Ambulatory Visit: Payer: Self-pay | Admitting: Obstetrics and Gynecology

## 2021-09-21 DIAGNOSIS — Z1231 Encounter for screening mammogram for malignant neoplasm of breast: Secondary | ICD-10-CM

## 2021-09-22 DIAGNOSIS — M79642 Pain in left hand: Secondary | ICD-10-CM | POA: Diagnosis not present

## 2021-11-25 ENCOUNTER — Other Ambulatory Visit: Payer: Self-pay | Admitting: Neurology

## 2021-11-25 DIAGNOSIS — G43709 Chronic migraine without aura, not intractable, without status migrainosus: Secondary | ICD-10-CM

## 2021-12-27 ENCOUNTER — Other Ambulatory Visit: Payer: Self-pay | Admitting: Neurology

## 2021-12-27 DIAGNOSIS — G43709 Chronic migraine without aura, not intractable, without status migrainosus: Secondary | ICD-10-CM

## 2021-12-28 MED ORDER — EMGALITY 120 MG/ML ~~LOC~~ SOAJ: 120.0000 mg | SUBCUTANEOUS | 2 refills | Status: DC

## 2022-01-30 ENCOUNTER — Telehealth: Payer: Self-pay | Admitting: Neurology

## 2022-01-30 ENCOUNTER — Encounter: Payer: Self-pay | Admitting: Neurology

## 2022-01-30 DIAGNOSIS — G43709 Chronic migraine without aura, not intractable, without status migrainosus: Secondary | ICD-10-CM

## 2022-01-30 MED ORDER — EMGALITY 120 MG/ML ~~LOC~~ SOAJ: 120.0000 mg | SUBCUTANEOUS | 0 refills | Status: DC

## 2022-01-30 NOTE — Telephone Encounter (Signed)
One refill sent 

## 2022-01-30 NOTE — Telephone Encounter (Signed)
Pt is calling. Stated she needs a P.A for Galcanezumab-gnlm (EMGALITY) 120 MG/ML SOAJ. Pt is asking if she can get a sample until medication is approved by insurance.

## 2022-01-30 NOTE — Telephone Encounter (Signed)
Pt calling requesting a refill on Galcanezumab-gnlm (EMGALITY) 120 MG/ML SOAJ. Refill should be sent to [id number] [place]

## 2022-01-31 ENCOUNTER — Other Ambulatory Visit (HOSPITAL_COMMUNITY): Payer: Self-pay

## 2022-01-31 NOTE — Telephone Encounter (Signed)
Pharmacy Patient Advocate Encounter   Received notification from [facility] that prior authorization for Emgality '120MG'$ /ML auto-injectors (migraine) is required/requested.  PA submitted on 01/31/2022 to (ins) [employer] [place] via CoverMyMeds Key [id number] Status is pending

## 2022-01-31 NOTE — Telephone Encounter (Signed)
Emgality approved effective from 01/31/2022 through 01/30/2023. Patient will still need yearly follow-ups for refills. She has a pending appointment next month.

## 2022-01-31 NOTE — Telephone Encounter (Signed)
Completed Emgality PA on CMM. Key: [id number]. Awaiting urgent determination from [facility].

## 2022-02-01 ENCOUNTER — Other Ambulatory Visit (HOSPITAL_COMMUNITY): Payer: Self-pay

## 2022-02-01 NOTE — Telephone Encounter (Signed)
Pharmacy Patient Advocate Encounter  Prior Authorization for Emgality '120MG'$ /ML auto-injectors (migraine) has been approved.    PA# No PA # available. Effective dates: 01/31/2022 through 01/30/2023

## 2022-02-21 ENCOUNTER — Telehealth (INDEPENDENT_AMBULATORY_CARE_PROVIDER_SITE_OTHER): Payer: BC Managed Care – PPO | Admitting: Neurology

## 2022-02-21 ENCOUNTER — Telehealth: Payer: Self-pay | Admitting: Neurology

## 2022-02-21 DIAGNOSIS — G43709 Chronic migraine without aura, not intractable, without status migrainosus: Secondary | ICD-10-CM | POA: Diagnosis not present

## 2022-02-21 MED ORDER — PROMETHAZINE HCL 12.5 MG PO TABS: 12.5000 mg | ORAL_TABLET | Freq: Three times a day (TID) | ORAL | 11 refills | Status: DC | PRN

## 2022-02-21 MED ORDER — EMGALITY 120 MG/ML ~~LOC~~ SOAJ: 120.0000 mg | SUBCUTANEOUS | 11 refills | Status: DC

## 2022-02-21 MED ORDER — RIZATRIPTAN BENZOATE 10 MG PO TABS: ORAL_TABLET | ORAL | 11 refills | Status: DC

## 2022-02-21 NOTE — Telephone Encounter (Signed)
F/u pne year with NP virtual please

## 2022-02-21 NOTE — Progress Notes (Signed)
[facility]    Provider:  Dr Jaynee Eagles Requesting Provider: Pleas Koch, NP Primary Care Provider:  Pleas Koch, NP  Virtual Visit via Video Note  I connected with Danielle Saunders on 02/21/22 at  9:30 AM EST by a video enabled telemedicine application and verified that I am speaking with the correct person using two identifiers.  Location: Patient: home Provider: office   I discussed the limitations of evaluation and management by telemedicine and the availability of in person appointments. The patient expressed understanding and agreed to proceed.   Follow Up Instructions:    I discussed the assessment and treatment plan with the patient. The patient was provided an opportunity to ask questions and all were answered. The patient agreed with the plan and demonstrated an understanding of the instructions.   The patient was advised to call back or seek an in-person evaluation if the symptoms worsen or if the condition fails to improve as anticipated.  I provided 20 minutes of non-face-to-face time during this encounter.   Melvenia Beam, MD   CC:  Migraines  2/7/2024She has had minimal headaches, doing great, takes tylenol, refill emgality, rizatriptan never used. Ondansetron. F/u another year. Refill early emgality bc of [employer].   Patient complains of symptoms per HPI as well as the following symptoms: none . Pertinent negatives and positives per HPI. All others negative   HPI:  Danielle Saunders is a 42 y.o. female here as requested by Pleas Koch, NP for migraines. She has had migraines since a teenage, worsening in the last 9 years. Usually left side, radiates to the back of the head and neck, pulsating/pounding/throbbing, nausea and vomiting, photo/phonophobia, movement makes it worse. She has 20 migraine days a month that are moderate to severe and other headaches over 20 daily . She had tubal ligation not planning on  children. Not positional, not exertional, she has been to the headache clinic, food diaries did not hekp, amitriptyline helps a little bit but having side effects. No new symptoms. Had normal CT in 2017. No medication overuse. 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 No other focal neurologic deficits, associated symptoms, inciting events or modifiable factors.  Reviewed notes, labs and imaging from outside physicians, which showed:   06/30/2019: normal cbc and cmp  From a thorough review of records, meds tried that can be used in migraine management include: Amitriptyline, baclofen, Decadron injections, ketorolac tablets and injections, ibuprofen, magnesium, Zofran tablets and injections, promethazine tablets and injections, sumatriptan, rizatriptan, tizanidine, Topamax, zolmitriptan, verapamil, propranolol contraindicated due to low blood pressure(she hs white coat syndrome BP is higher in the office usually a lot lower), zonegran  10/12/2015:   CT Head showed No acute intracranial abnormalities including mass lesion or mass effect, hydrocephalus, extra-axial fluid collection, midline shift, hemorrhage, or acute infarction, large ischemic events (personally reviewed images)    Medications tried that can be used in migraine management include:   Review of Systems: Patient complains of symptoms per HPI as well as the following symptoms weight gain. Pertinent negatives and positives per HPI. All others negative.   Social History   Socioeconomic History   Marital status: Married    Spouse name: Not on file   Number of children: Not on file   Years of education: Not on file   Highest education level: Not on file  Occupational History   Not on file  Tobacco Use   Smoking status: Never   Smokeless tobacco: Never  Vaping  Use   Vaping Use: Never used  Substance and Sexual Activity   Alcohol use: No    Alcohol/week: 0.0 standard drinks of alcohol   Drug use: Not on file   Sexual activity: Not on file     Comment: Tuberligation  Other Topics Concern   Not on file  Social History Narrative   Married.   2 children.   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    Enjoys relaxing and spending time with her family.    Social Determinants of Health   Financial Resource Strain: Not on file  Food Insecurity: Not on file  Transportation Needs: Not on file  Physical Activity: Not on file  Stress: Not on file  Social Connections: Not on file  Intimate Partner Violence: Not on file    Family History  Problem Relation Age of Onset   Hyperlipidemia Father    Migraines Paternal Aunt     Past Medical History:  Diagnosis Date   Chronic tension headaches    DEPRESSIVE DISORDER, NOS 03/14/2006   Qualifier: Diagnosis of  By: Samara Snide     Genital herpes    PONV (postoperative nausea and vomiting)     Patient Active Problem List   Diagnosis Date Noted   Flank pain 12/16/2019   Genital herpes 06/30/2019   Migraine 05/30/2017   Preventative health care 02/18/2015   Chronic tension-type headache, not intractable 10/27/2014   CERVICAL DYSPLASIA 03/14/2006    Past Surgical History:  Procedure Laterality Date   ABDOMINOPLASTY     AUGMENTATION MAMMAPLASTY Bilateral 2017   BREAST ENHANCEMENT SURGERY     IUD REMOVAL N/A 02/11/2019   Procedure: INTRAUTERINE DEVICE (IUD) REMOVAL;  Surgeon: Christophe Louis, MD;  Location: [facility];  Service: Gynecology;  Laterality: N/A;   LAPAROSCOPIC TUBAL LIGATION Bilateral 02/11/2019   Procedure: LAPAROSCOPIC TUBAL LIGATION;  Surgeon: Christophe Louis, MD;  Location: [facility];  Service: Gynecology;  Laterality: Bilateral;    Current Outpatient Medications  Medication Sig Dispense Refill   amitriptyline (ELAVIL) 50 MG tablet TAKE 1 TABLET BY MOUTH AT BEDTIME FOR HEADACHE PREVENTION. 90 tablet 1   cholecalciferol (VITAMIN D3) 25 MCG (1000 UNIT) tablet Take 1,000 Units by mouth daily.     Galcanezumab-gnlm (EMGALITY) 120 MG/ML SOAJ Inject 120 mg  into the skin every 30 (thirty) days. Must be seen for further refills. Call [phone]. 3 mL 11   ibuprofen (ADVIL) 800 MG tablet Take 1 tablet (800 mg total) by mouth every 8 (eight) hours as needed. 30 tablet 0   ketorolac (TORADOL) 10 MG tablet Take 1 tablet (10 mg total) by mouth every 8 (eight) hours as needed (migraines). 20 tablet 0   Magnesium 100 MG CAPS Take by mouth.     Multiple Vitamin (MULTIVITAMIN) tablet Take 1 tablet by mouth daily.     Probiotic Product (PROBIOTIC-10 PO) Take by mouth.     promethazine (PHENERGAN) 12.5 MG tablet Take 1-2 tablets (12.5-25 mg total) by mouth every 8 (eight) hours as needed for nausea or vomiting. 30 tablet 11   rizatriptan (MAXALT) 10 MG tablet Take 1 tablet by mouth at migraine onset.  May repeat in 2 hours if needed.  Do not exceed 2 tablets in 24 hours. 10 tablet 11   tiZANidine (ZANAFLEX) 4 MG tablet Take 1-2 tablets by mouth twice daily as needed for migraines. Limit 1-2 treatments per week 30 tablet 0   valACYclovir (VALTREX) 500 MG tablet TAKE ONE TABLET BY MOUTH EVERY  24 HOURS.  9   No current facility-administered medications for this visit.    Allergies as of 02/21/2022   (No Known Allergies)    Vitals: There were no vitals taken for this visit. Last Weight:  Wt Readings from Last 1 Encounters:  10/31/20 171 lb 6.4 oz (77.7 kg)   Last Height:   Ht Readings from Last 1 Encounters:  10/31/20 '5\' 1"'$  (1.549 m)    Physical exam: Exam: Gen: NAD, conversant      CV:  No palpitations or chest pain or SOB. VS: Breathing at a normal rate. . Not febrile. Eyes: Conjunctivae clear without exudates or hemorrhage  Neuro: Detailed Neurologic Exam  Speech:    Speech is normal; fluent and spontaneous with normal comprehension.  Cognition:    The patient is oriented to person, place, and time;     recent and remote memory intact;     language fluent;     normal attention, concentration,     fund of knowledge Cranial Nerves:     The pupils are equal, round, and reactive to light. Cannot perform fundoscopic exam. Visual fields are full to finger confrontation. Extraocular movements are intact.  The face is symmetric with normal sensation. The palate elevates in the midline. Hearing intact. Voice is normal. Shoulder shrug is normal. The tongue has normal motion without fasciculations.   Coordination:    Normal finger to nose  Gait:    Normal native gait  Motor Observation:   no involuntary movements noted. Tone:    Appears normal  Posture:    Posture is normal. normal erect    Strength:    Strength is anti-gravity and symmetric in the upper and lower limbs.      Sensation: intact to LT         Assessment/Plan:  Patient with chronic migraines. No red flags to indicate needed imaging but if does not improve consider MRI brain. She is not having children, had tubal ligation, do not get pregnant on emgality until stopping for 6 months discussed, also discussed increased risk of stroke in migraine with aura.   Prevention: Continue Emgality doing great Acute: Maxalt and phenergen. If doesn't work consider Roselyn Meier or Nurtec When starting to feel much better go down 1/2 amitriptyline or stop   No orders of the defined types were placed in this encounter.   Meds ordered this encounter  Medications   Galcanezumab-gnlm (EMGALITY) 120 MG/ML SOAJ    Sig: Inject 120 mg into the skin every 30 (thirty) days. Must be seen for further refills. Call [phone].    Dispense:  3 mL    Refill:  11    Must be seen for further refills.   promethazine (PHENERGAN) 12.5 MG tablet    Sig: Take 1-2 tablets (12.5-25 mg total) by mouth every 8 (eight) hours as needed for nausea or vomiting.    Dispense:  30 tablet    Refill:  11   rizatriptan (MAXALT) 10 MG tablet    Sig: Take 1 tablet by mouth at migraine onset.  May repeat in 2 hours if needed.  Do not exceed 2 tablets in 24 hours.    Dispense:  10 tablet    Refill:  11      Cc: Pleas Koch, NP,  Pleas Koch, NP  Sarina Ill, MD  [facility] [address] [place], [place] [address]  Phone [phone] Fax [phone]

## 2022-02-22 NOTE — Telephone Encounter (Signed)
Scheduled for a VV with Amy 02/25/23 at 9:30 am.

## 2022-02-24 ENCOUNTER — Encounter: Payer: Self-pay | Admitting: Neurology

## 2022-02-26 ENCOUNTER — Other Ambulatory Visit: Payer: Self-pay | Admitting: *Deleted

## 2022-02-26 DIAGNOSIS — G43709 Chronic migraine without aura, not intractable, without status migrainosus: Secondary | ICD-10-CM

## 2022-02-26 MED ORDER — PROMETHAZINE HCL 12.5 MG PO TABS: 12.5000 mg | ORAL_TABLET | Freq: Three times a day (TID) | ORAL | 11 refills | Status: AC | PRN

## 2022-02-26 MED ORDER — EMGALITY 120 MG/ML ~~LOC~~ SOAJ: 120.0000 mg | SUBCUTANEOUS | 3 refills | Status: DC

## 2022-02-26 MED ORDER — RIZATRIPTAN BENZOATE 10 MG PO TABS: ORAL_TABLET | ORAL | 11 refills | Status: DC

## 2022-02-26 NOTE — Telephone Encounter (Signed)
Tyhee and canceled the Emgality, Phenergan, and Rizatriptan. Sent all 3 to [facility] per pt request.

## 2022-02-26 NOTE — Telephone Encounter (Signed)
Prescriptions sent to [facility] and canceled at [facility].

## 2022-03-29 DIAGNOSIS — Z01419 Encounter for gynecological examination (general) (routine) without abnormal findings: Secondary | ICD-10-CM | POA: Diagnosis not present

## 2022-09-05 DIAGNOSIS — S70361A Insect bite (nonvenomous), right thigh, initial encounter: Secondary | ICD-10-CM | POA: Diagnosis not present

## 2022-09-05 DIAGNOSIS — L821 Other seborrheic keratosis: Secondary | ICD-10-CM | POA: Diagnosis not present

## 2022-09-05 DIAGNOSIS — D225 Melanocytic nevi of trunk: Secondary | ICD-10-CM | POA: Diagnosis not present

## 2022-09-05 DIAGNOSIS — L814 Other melanin hyperpigmentation: Secondary | ICD-10-CM | POA: Diagnosis not present

## 2022-10-02 ENCOUNTER — Encounter: Payer: Self-pay | Admitting: Neurology

## 2022-10-07 ENCOUNTER — Other Ambulatory Visit: Payer: Self-pay | Admitting: Neurology

## 2022-10-21 ENCOUNTER — Other Ambulatory Visit: Payer: Self-pay | Admitting: Neurology

## 2022-10-21 MED ORDER — CYCLOBENZAPRINE HCL 10 MG PO TABS: 10.0000 mg | ORAL_TABLET | Freq: Three times a day (TID) | ORAL | 3 refills | Status: AC | PRN

## 2023-02-08 DIAGNOSIS — J029 Acute pharyngitis, unspecified: Secondary | ICD-10-CM | POA: Diagnosis not present

## 2023-02-08 DIAGNOSIS — J069 Acute upper respiratory infection, unspecified: Secondary | ICD-10-CM | POA: Diagnosis not present

## 2023-02-08 DIAGNOSIS — Z03818 Encounter for observation for suspected exposure to other biological agents ruled out: Secondary | ICD-10-CM | POA: Diagnosis not present

## 2023-02-20 ENCOUNTER — Encounter: Payer: Self-pay | Admitting: Primary Care

## 2023-02-20 ENCOUNTER — Ambulatory Visit: Payer: BC Managed Care – PPO | Admitting: Primary Care

## 2023-02-20 VITALS — BP 106/78 | HR 85 | Temp 97.9°F | Ht 61.0 in | Wt 164.0 lb

## 2023-02-20 DIAGNOSIS — J0100 Acute maxillary sinusitis, unspecified: Secondary | ICD-10-CM | POA: Diagnosis not present

## 2023-02-20 MED ORDER — HYDROCOD POLI-CHLORPHE POLI ER 10-8 MG/5ML PO SUER: 5.0000 mL | Freq: Two times a day (BID) | ORAL | 0 refills | Status: DC | PRN

## 2023-02-20 MED ORDER — AMOXICILLIN-POT CLAVULANATE 875-125 MG PO TABS: 1.0000 | ORAL_TABLET | Freq: Two times a day (BID) | ORAL | 0 refills | Status: DC

## 2023-02-20 NOTE — Progress Notes (Signed)
 Subjective:    Patient ID: Danielle Saunders, female    DOB: [birth year], 43 y.o.   MRN: [id number]  HPI  Danielle Saunders is a very pleasant 43 y.o. female with a history of migraines, headaches who presents today to discuss cough.  She has not been seen in our office since March 2022.  Symptom onset 01/08/23 with nasal congestion, rhinorrhea, sore throat, post nasal drip, cough. Evaluated at [facility] 02/08/23 for these symptoms, tested negative for Covid-19 infection, influenza, and RSV.   Today she continues to experience symptoms of persistent left sided nasal congestion with rhinorrhea, bilateral maxillary sinus pressure, and cough. She denies fevers, body aches, chills, shortness of breath. She's tried Dayquil, Nyquil, Mucinex, Afrin nasal spray, Elderberry, Zinc without improvement.    Review of Systems  Constitutional:  Negative for chills and fever.  HENT:  Positive for congestion, postnasal drip, sinus pressure and sinus pain. Negative for sore throat.   Respiratory:  Positive for cough. Negative for shortness of breath.   Cardiovascular:  Negative for chest pain.  Allergic/Immunologic: Negative for environmental allergies.  Neurological:  Positive for headaches.         Past Medical History:  Diagnosis Date   Chronic tension headaches    DEPRESSIVE DISORDER, NOS 03/14/2006   Qualifier: Diagnosis of  By: Manford Longs     Genital herpes    PONV (postoperative nausea and vomiting)     Social History   Socioeconomic History   Marital status: Married    Spouse name: Not on file   Number of children: Not on file   Years of education: Not on file   Highest education level: Not on file  Occupational History   Not on file  Tobacco Use   Smoking status: Never   Smokeless tobacco: Never  Vaping Use   Vaping status: Never Used  Substance and Sexual Activity   Alcohol use: No    Alcohol/week: 0.0 standard drinks of alcohol   Drug use: Not on file   Sexual  activity: Not on file    Comment: Tuberligation  Other Topics Concern   Not on file  Social History Narrative   Married.   2 children.   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    Enjoys relaxing and spending time with her family.    Social Drivers of Corporate Investment Banker Strain: Not on file  Food Insecurity: Not on file  Transportation Needs: Not on file  Physical Activity: Not on file  Stress: Not on file  Social Connections: Not on file  Intimate Partner Violence: Not on file    Past Surgical History:  Procedure Laterality Date   ABDOMINOPLASTY     AUGMENTATION MAMMAPLASTY Bilateral 2017   BREAST ENHANCEMENT SURGERY     IUD REMOVAL N/A 02/11/2019   Procedure: INTRAUTERINE DEVICE (IUD) REMOVAL;  Surgeon: Rosalva Sawyer, MD;  Location: [facility];  Service: Gynecology;  Laterality: N/A;   LAPAROSCOPIC TUBAL LIGATION Bilateral 02/11/2019   Procedure: LAPAROSCOPIC TUBAL LIGATION;  Surgeon: Rosalva Sawyer, MD;  Location: [facility];  Service: Gynecology;  Laterality: Bilateral;    Family History  Problem Relation Age of Onset   Hyperlipidemia Father    Migraines Paternal Aunt     No Known Allergies  Current Outpatient Medications on File Prior to Visit  Medication Sig Dispense Refill   cholecalciferol (VITAMIN D3) 25 MCG (1000 UNIT) tablet Take 1,000 Units by mouth daily.  Galcanezumab -gnlm (EMGALITY ) 120 MG/ML SOAJ Inject 120 mg into the skin every 30 (thirty) days. 3 mL 3   ibuprofen  (ADVIL ) 800 MG tablet Take 1 tablet (800 mg total) by mouth every 8 (eight) hours as needed. 30 tablet 0   Magnesium 100 MG CAPS Take by mouth.     Multiple Vitamin (MULTIVITAMIN) tablet Take 1 tablet by mouth daily.     valACYclovir (VALTREX) 500 MG tablet TAKE ONE TABLET BY MOUTH EVERY 24 HOURS.  9   amitriptyline  (ELAVIL ) 50 MG tablet TAKE 1 TABLET BY MOUTH AT BEDTIME FOR HEADACHE PREVENTION. (Patient not taking: Reported on 02/20/2023) 90 tablet 1   cyclobenzaprine   (FLEXERIL ) 10 MG tablet Take 1 tablet (10 mg total) by mouth 3 (three) times daily as needed for muscle spasms. (Patient not taking: Reported on 02/20/2023) 90 tablet 3   ketorolac  (TORADOL ) 10 MG tablet Take 1 tablet (10 mg total) by mouth every 8 (eight) hours as needed (migraines). (Patient not taking: Reported on 02/20/2023) 20 tablet 0   Probiotic Product (PROBIOTIC-10 PO) Take by mouth. (Patient not taking: Reported on 02/20/2023)     promethazine  (PHENERGAN ) 12.5 MG tablet Take 1-2 tablets (12.5-25 mg total) by mouth every 8 (eight) hours as needed for nausea or vomiting. (Patient not taking: Reported on 02/20/2023) 30 tablet 11   rizatriptan  (MAXALT ) 10 MG tablet Take 1 tablet by mouth at migraine onset.  May repeat in 2 hours if needed.  Do not exceed 2 tablets in 24 hours. (Patient not taking: Reported on 02/20/2023) 10 tablet 11   No current facility-administered medications on file prior to visit.    BP 106/78   Pulse 85   Temp 97.9 F (36.6 C) (Temporal)   Ht 5' 1 (1.549 m)   Wt 164 lb (74.4 kg)   SpO2 94%   BMI 30.99 kg/m  Objective:   Physical Exam Constitutional:      Appearance: She is ill-appearing.  HENT:     Right Ear: Tympanic membrane and ear canal normal.     Left Ear: Tympanic membrane and ear canal normal.     Nose: No mucosal edema.     Right Sinus: No maxillary sinus tenderness or frontal sinus tenderness.     Left Sinus: No maxillary sinus tenderness or frontal sinus tenderness.     Mouth/Throat:     Mouth: Mucous membranes are moist.  Eyes:     Conjunctiva/sclera: Conjunctivae normal.  Cardiovascular:     Rate and Rhythm: Normal rate and regular rhythm.  Pulmonary:     Effort: Pulmonary effort is normal.     Breath sounds: Normal breath sounds. No wheezing or rhonchi.     Comments: Dry cough during exam Musculoskeletal:     Cervical back: Neck supple.  Skin:    General: Skin is warm and dry.           Assessment & Plan:  Acute non-recurrent  maxillary sinusitis Assessment & Plan: Symptoms and duration suggestive of acute non-recurrent maxillary sinusitis   START:  Augmentin  875/125mg  twice daily for 7 days  Tussionex 10-8mg /74mL - take 5mL twice daily as needed for cough  Education provided on medication side effects  Return precautions provided   I evaluated patient, was consulted regarding treatment, and agree with assessment and plan per Andriette Eke, MSN, FNP student.   Mallie Gaskins, NP-C   Orders: -     Amoxicillin -Pot Clavulanate; Take 1 tablet by mouth 2 (two) times daily.  Dispense: 14 tablet;  Refill: 0 -     Hydrocod Poli-Chlorphe Poli ER; Take 5 mLs by mouth every 12 (twelve) hours as needed for cough.  Dispense: 50 mL; Refill: 0        Comer MARLA Gaskins, NP

## 2023-02-20 NOTE — Patient Instructions (Signed)
 Below is our plan:  We will continue Emgality  monthly. Continue OTC analgesics as needed but do not use regularly. Continue cyclobenzaprine , rizatriptan  and or phenergan  sparingly as needed for intractable headaches.   Please make sure you are staying well hydrated. I recommend 50-60 ounces daily. Well balanced diet and regular exercise encouraged. Consistent sleep schedule with 6-8 hours recommended.   Please continue follow up with care team as directed.   Follow up with me in 1 year   You may receive a survey regarding today's visit. I encourage you to leave honest feed back as I do use this information to improve patient care. Thank you for seeing me today!   GENERAL HEADACHE INFORMATION:   Natural supplements: Magnesium Oxide or Magnesium Glycinate 500 mg at bed (up to 800 mg daily) Coenzyme Q10 300 mg in AM Vitamin B2- 200 mg twice a day   Add 1 supplement at a time since even natural supplements can have undesirable side effects. You can sometimes buy supplements cheaper (especially Coenzyme Q10) at www.WebmailGuide.co.za or at [facility].  Migraine with aura: There is increased risk for stroke in women with migraine with aura and a contraindication for the combined contraceptive pill for use by women who have migraine with aura. The risk for women with migraine without aura is lower. However other risk factors like smoking are far more likely to increase stroke risk than migraine. There is a recommendation for no smoking and for the use of OCPs without estrogen such as progestogen only pills particularly for women with migraine with aura.Aaron Aas People who have migraine headaches with auras may be 3 times more likely to have a stroke caused by a blood clot, compared to migraine patients who don't see auras. Women who take hormone-replacement therapy may be 30 percent more likely to suffer a clot-based stroke than women not taking medication containing estrogen. Other risk factors like smoking and high  blood pressure may be  much more important.    Vitamins and herbs that show potential:   Magnesium: Magnesium (250 mg twice a day or 500 mg at bed) has a relaxant effect on smooth muscles such as blood vessels. Individuals suffering from frequent or daily headache usually have low magnesium levels which can be increase with daily supplementation of 400-750 mg. Three trials found 40-90% average headache reduction  when used as a preventative. Magnesium may help with headaches are aura, the best evidence for magnesium is for migraine with aura is its thought to stop the cortical spreading depression we believe is the pathophysiology of migraine aura.Magnesium also demonstrated the benefit in menstrually related migraine.  Magnesium is part of the messenger system in the serotonin cascade and it is a good muscle relaxant.  It is also useful for constipation which can be a side effect of other medications used to treat migraine. Good sources include nuts, whole grains, and tomatoes. Side Effects: loose stool/diarrhea  Riboflavin (vitamin B 2) 200 mg twice a day. This vitamin assists nerve cells in the production of ATP a principal energy storing molecule.  It is necessary for many chemical reactions in the body.  There have been at least 3 clinical trials of riboflavin using 400 mg per day all of which suggested that migraine frequency can be decreased.  All 3 trials showed significant improvement in over half of migraine sufferers.  The supplement is found in bread, cereal, milk, meat, and poultry.  Most Americans get more riboflavin than the recommended daily allowance, however riboflavin deficiency  is not necessary for the supplements to help prevent headache. Side effects: energizing, green urine   Coenzyme Q10: This is present in almost all cells in the body and is critical component for the conversion of energy.  Recent studies have shown that a nutritional supplement of CoQ10 can reduce the frequency of  migraine attacks by improving the energy production of cells as with riboflavin.  Doses of 150 mg twice a day have been shown to be effective.   Melatonin: Increasing evidence shows correlation between melatonin secretion and headache conditions.  Melatonin supplementation has decreased headache intensity and duration.  It is widely used as a sleep aid.  Sleep is natures way of dealing with migraine.  A dose of 3 mg is recommended to start for headaches including cluster headache. Higher doses up to 15 mg has been reviewed for use in Cluster headache and have been used. The rationale behind using melatonin for cluster is that many theories regarding the cause of Cluster headache center around the disruption of the normal circadian rhythm in the brain.  This helps restore the normal circadian rhythm.   HEADACHE DIET: Foods and beverages which may trigger migraine Note that only 20% of headache patients are food sensitive. You will know if you are food sensitive if you get a headache consistently 20 minutes to 2 hours after eating a certain food. Only cut out a food if it causes headaches, otherwise you might remove foods you enjoy! What matters most for diet is to eat a well balanced healthy diet full of vegetables and low fat protein, and to not miss meals.   Chocolate, other sweets ALL cheeses except cottage and cream cheese Dairy products, yogurt, sour cream, ice cream Liver Meat extracts (Bovril, Marmite, meat tenderizers) Meats or fish which have undergone aging, fermenting, pickling or smoking. These include: Hotdogs,salami,Lox,sausage, mortadellas,smoked salmon, pepperoni, Pickled herring Pods of broad bean (English beans, Chinese pea pods, [place] (fava) beans, lima and navy beans Ripe avocado, ripe banana Yeast extracts or active yeast preparations such as Brewer's or Fleishman's (commercial bakes goods are permitted) Tomato based foods, pizza (lasagna, etc.)   MSG (monosodium glutamate)  is disguised as many things; look for these common aliases: Monopotassium glutamate Autolysed yeast Hydrolysed protein Sodium caseinate "flavorings" "all natural preservatives" Nutrasweet   Avoid all other foods that convincingly provoke headaches.   Resources: The Dizzy Althia Jetty Your Headache Diet, migrainestrong.com  https://zamora-andrews.com/   Caffeine and Migraine For patients that have migraine, caffeine intake more than 3 days per week can lead to dependency and increased migraine frequency. I would recommend cutting back on your caffeine intake as best you can. The recommended amount of caffeine is 200-300 mg daily, although migraine patients may experience dependency at even lower doses. While you may notice an increase in headache temporarily, cutting back will be helpful for headaches in the long run. For more information on caffeine and migraine, visit: https://americanmigrainefoundation.org/resource-library/caffeine-and-migraine/   Headache Prevention Strategies:   1. Maintain a headache diary; learn to identify and avoid triggers.  - This can be a simple note where you log when you had a headache, associated symptoms, and medications used - There are several smartphone apps developed to help track migraines: Migraine Buddy, Migraine Monitor, Curelator N1-Headache App   Common triggers include: Emotional triggers: Emotional/Upset family or friends Emotional/Upset occupation Business reversal/success Anticipation anxiety Crisis-serious Post-crisis periodNew job/position   Physical triggers: Vacation Day Weekend Strenuous Exercise High Altitude Location New Move Menstrual Day Physical Illness Oversleep/Not  enough sleep Weather changes Light: Photophobia or light sesnitivity treatment involves a balance between desensitization and reduction in overly strong input. Use dark polarized glasses outside, but not inside.  Avoid bright or fluorescent light, but do not dim environment to the point that going into a normally lit room hurts. Consider FL-41 tint lenses, which reduce the most irritating wavelengths without blocking too much light.  These can be obtained at axonoptics.com or theraspecs.com Foods: see list above.   2. Limit use of acute treatments (over-the-counter medications, triptans, etc.) to no more than 2 days per week or 10 days per month to prevent medication overuse headache (rebound headache).     3. Follow a regular schedule (including weekends and holidays): Don't skip meals. Eat a balanced diet. 8 hours of sleep nightly. Minimize stress. Exercise 30 minutes per day. Being overweight is associated with a 5 times increased risk of chronic migraine. Keep well hydrated and drink 6-8 glasses of water per day.   4. Initiate non-pharmacologic measures at the earliest onset of your headache. Rest and quiet environment. Relax and reduce stress. Breathe2Relax is a free app that can instruct you on    some simple relaxtion and breathing techniques. Http://Dawnbuse.com is a    free website that provides teaching videos on relaxation.  Also, there are  many apps that   can be downloaded for "mindful" relaxation.  An app called YOGA NIDRA will help walk you through mindfulness. Another app called Calm can be downloaded to give you a structured mindfulness guide with daily reminders and skill development. Headspace for guided meditation Mindfulness Based Stress Reduction Online Course: www.palousemindfulness.com Cold compresses.   5. Don't wait!! Take the maximum allowable dosage of prescribed medication at the first sign of migraine.   6. Compliance:  Take prescribed medication regularly as directed and at the first sign of a migraine.   7. Communicate:  Call your physician when problems arise, especially if your headaches change, increase in frequency/severity, or become associated with neurological  symptoms (weakness, numbness, slurred speech, etc.). Proceed to emergency room if you experience new or worsening symptoms or symptoms do not resolve, if you have new neurologic symptoms or if headache is severe, or for any concerning symptom.   8. Headache/pain management therapies: Consider various complementary methods, including medication, behavioral therapy, psychological counselling, biofeedback, massage therapy, acupuncture, dry needling, and other modalities.  Such measures may reduce the need for medications. Counseling for pain management, where patients learn to function and ignore/minimize their pain, seems to work very well.   9. Recommend changing family's attention and focus away from patient's headaches. Instead, emphasize daily activities. If first question of day is 'How are your headaches/Do you have a headache today?', then patient will constantly think about headaches, thus making them worse. Goal is to re-direct attention away from headaches, toward daily activities and other distractions.   10. Helpful Websites: www.AmericanHeadacheSociety.org PatentHood.ch www.headaches.org TightMarket.nl www.achenet.org

## 2023-02-20 NOTE — Patient Instructions (Signed)
Please start Augmentin twice daily -- take for 7 days.  Tussionex -- you can take twice daily as needed for cough. This can cause drowsiness.   Please schedule your annual physical when able/you are feeling better  If was nice to see you today.

## 2023-02-20 NOTE — Progress Notes (Signed)
 Acute Office Visit  Subjective:     Patient ID: Danielle Saunders, female    DOB: 03-19-1980, 43 y.o.   MRN: [id number]  Chief Complaint  Patient presents with   Nasal Congestion    Congestion, runny nose, sore throat, throat drainage Pt states she has been sick since 12/24    HPI Danielle Saunders is a 43 year old female with history of migraines, followed by neurology, who presents today to discuss congestion, runny nose, sore throat and drainage x6 weeks.  Symptoms began 01/08/23 with bilateral maxillary facial pressure, left greater than right, with sinus congestion and rhinorrhea. Drainage is mostly clear, occasionally purulent. Sore throat is persistent. Cough is non-productive. Persistent watery eyes. Her symptoms are impacting her ability to get a restful night sleep due to congestion and having to sleep propped up for relief. She reports breathing through mouth due to nasal congestion. Denies body aches, chills, fevers. Denies headaches, vision changes, shortness of breath, chest pains. Denies sick contacts and reports family members have not gotten ill since she was been unwell.   She has tried several over-the-counter medications with no relief: DayQuil x1 week - no relief NyQuil x 1 week - no relief Mucinex DM - since 02/09/23 - no relief Afrin w/decongestant 02/19/23 PM - no relief relief Elderberry/zinc - x4-5 days - no relief  She was evaluated on 02/08/23 at [facility] urgent care for symptoms of viral infection. Tested negative for COVID, flu A & B, RSV. Records reviewed from Care Everywhere.   Review of Systems  Constitutional:  Positive for malaise/fatigue. Negative for chills and fever.  HENT:  Positive for congestion and sinus pain.        Positive for ear fullness  Eyes:  Positive for discharge. Negative for blurred vision, double vision and pain.       Watery eyes  Respiratory:  Positive for cough. Negative for shortness of breath.   Cardiovascular:  Negative for chest  pain.  Neurological:  Negative for headaches.  Endo/Heme/Allergies:  Negative for environmental allergies.       Objective:    BP 106/78   Pulse 85   Temp 97.9 F (36.6 C) (Temporal)   Ht 5' 1 (1.549 m)   Wt 74.4 kg   SpO2 94%   BMI 30.99 kg/m    Physical Exam Constitutional:      Appearance: Normal appearance.  HENT:     Right Ear: Tympanic membrane and ear canal normal.     Left Ear: Tympanic membrane and ear canal normal.     Nose: Congestion and rhinorrhea present.     Mouth/Throat:     Mouth: Mucous membranes are moist.     Pharynx: Oropharynx is clear.  Eyes:     Extraocular Movements: Extraocular movements intact.     Comments: Increased lacrimation bilaterally   Cardiovascular:     Rate and Rhythm: Normal rate and regular rhythm.     Pulses: Normal pulses.     Heart sounds: Normal heart sounds.  Pulmonary:     Effort: Pulmonary effort is normal.     Breath sounds: Normal breath sounds.  Musculoskeletal:     Cervical back: Normal range of motion and neck supple.  Skin:    General: Skin is warm and dry.  Neurological:     General: No focal deficit present.     Mental Status: She is alert and oriented to person, place, and time.  Psychiatric:        Mood and  Affect: Mood normal.        Behavior: Behavior normal.     No results found for any visits on 02/20/23.      Assessment & Plan:   Problem List Items Addressed This Visit       Respiratory   Acute non-recurrent maxillary sinusitis - Primary   Symptoms and duration suggestive of acute non-recurrent maxillary sinusitis   START:  Augmentin  875/125mg  twice daily for 7 days  Tussionex 10-8mg /6mL - take 5mL twice daily as needed for cough  Education provided on medication side effects  Return precautions provided       Relevant Medications   amoxicillin -clavulanate (AUGMENTIN ) 875-125 MG tablet   chlorpheniramine-HYDROcodone  (TUSSIONEX) 10-8 MG/5ML    Meds ordered this encounter   Medications   amoxicillin -clavulanate (AUGMENTIN ) 875-125 MG tablet    Sig: Take 1 tablet by mouth 2 (two) times daily.    Dispense:  14 tablet    Refill:  0   chlorpheniramine-HYDROcodone  (TUSSIONEX) 10-8 MG/5ML    Sig: Take 5 mLs by mouth every 12 (twelve) hours as needed for cough.    Dispense:  50 mL    Refill:  0    Disposition:   Contact office if symptoms have not improved   Schedule routine physical exam at earliest convenience   Saira Kramme M Magalene Mclear, RN

## 2023-02-20 NOTE — Assessment & Plan Note (Addendum)
 Symptoms and duration suggestive of acute non-recurrent maxillary sinusitis   START:  Augmentin  875/125mg  twice daily for 7 days  Tussionex 10-8mg /51mL - take 5mL twice daily as needed for cough  Education provided on medication side effects  Return precautions provided   I evaluated patient, was consulted regarding treatment, and agree with assessment and plan per Andriette Eke, MSN, FNP student.   Mallie Gaskins, NP-C

## 2023-02-20 NOTE — Progress Notes (Signed)
 PATIENT: Danielle Saunders DOB: Jun 18, 1980  REASON FOR VISIT: follow up HISTORY FROM: patient  Virtual Visit via Telephone Note  I connected with Raynette Cam on 02/25/23 at  9:30 AM EST by telephone and verified that I am speaking with the correct person using two identifiers.   I discussed the limitations, risks, security and privacy concerns of performing an evaluation and management service by telephone and the availability of in person appointments. I also discussed with the patient that there may be a patient responsible charge related to this service. The patient expressed understanding and agreed to proceed.   History of Present Illness:  02/25/23 ALL: Danielle Saunders is a 43 y.o. female here today for follow up for migraines. She was last seen by Dr Tresia Fruit 02/2022 and doing well. She continues Emgality . Amitriptyline  per PCP but only PRN. Cyclobenzaprine , rizatriptan , phenergan  all used PRN. She rarely uses these and can't remember the last time they were needed. She usually takes OTC analgesics. She has not used Toradol . Migraines have been very well managed. She is being treated for a sinus infection but reports migraines have remained stable.   History (copied from Dr Harding Li previous note)  02/21/2022: She has had minimal headaches, doing great, takes tylenol , refill emgality , rizatriptan  never used. Ondansetron . F/u another year. 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    Patient complains of symptoms per HPI as well as the following symptoms: none . Pertinent negatives and positives per HPI. All others negative     HPI:  Danielle Saunders is a 43 y.o. female here as requested by Clark, Katherine K, NP for migraines. She has had migraines since a teenage, worsening in the last 9 years. Usually left side, radiates to the back of the head and neck, pulsating/pounding/throbbing, nausea and vomiting, photo/phonophobia, movement makes it worse. She has 20 migraine days a month  that are moderate to severe and other headaches over 20 daily . She had tubal ligation not planning on children. Not positional, not exertional, she has been to the headache clinic, food diaries did not hekp, amitriptyline  helps a little bit but having side effects. No new symptoms. Had normal CT in 2017. No medication overuse. 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 No other focal neurologic deficits, associated symptoms, inciting events or modifiable factors.   Reviewed notes, labs and imaging from outside physicians, which showed:    06/30/2019: normal cbc and cmp   From a thorough review of records, meds tried that can be used in migraine management include: Amitriptyline , baclofen , Decadron  injections, ketorolac  tablets and injections, ibuprofen , magnesium, Zofran  tablets and injections, promethazine  tablets and injections, sumatriptan , rizatriptan , tizanidine , Topamax , zolmitriptan , verapamil, propranolol contraindicated due to low blood pressure(she hs white coat syndrome BP is higher in the office usually a lot lower), zonegran    10/12/2015:    CT Head showed No acute intracranial abnormalities including mass lesion or mass effect, hydrocephalus, extra-axial fluid collection, midline shift, hemorrhage, or acute infarction, large ischemic events (personally reviewed images)   Observations/Objective:  Generalized: Well developed, in no acute distress  Mentation: Alert oriented to time, place, history taking. Follows all commands speech and language fluent   Assessment and Plan:  43 y.o. year old female  has a past medical history of Chronic tension headaches, DEPRESSIVE DISORDER, NOS (03/14/2006), Genital herpes, and PONV (postoperative nausea and vomiting). here with    ICD-10-CM   1. Chronic migraine without aura without status migrainosus, not intractable  G43.709 Galcanezumab -gnlm (EMGALITY ) 120  MG/ML SOAJ     Divine is doing very well. Migraines are well managed. We will continue Emgality   injections monthly. May use cyclobenzaprine , rizatriptan  and phenergan  sparingly as needed. She will continue healthy lifestyle habits and regular follow up with PCP. She will return to see me in 1 year, sooner if needed.    No orders of the defined types were placed in this encounter.   Meds ordered this encounter  Medications   Galcanezumab -gnlm (EMGALITY ) 120 MG/ML SOAJ    Sig: Inject 120 mg into the skin every 30 (thirty) days.    Dispense:  3 mL    Refill:  3    90 day supply    Supervising Provider:   Glory Larsen [[id number]]     Follow Up Instructions:  I discussed the assessment and treatment plan with the patient. The patient was provided an opportunity to ask questions and all were answered. The patient agreed with the plan and demonstrated an understanding of the instructions.   The patient was advised to call back or seek an in-person evaluation if the symptoms worsen or if the condition fails to improve as anticipated.  I provided 15 minutes of non-face-to-face time during this encounter. Patient located at their place of residence during Mychart visit. Provider is in the office.    Danielle Clouatre, NP

## 2023-02-25 ENCOUNTER — Telehealth (INDEPENDENT_AMBULATORY_CARE_PROVIDER_SITE_OTHER): Payer: BC Managed Care – PPO | Admitting: Family Medicine

## 2023-02-25 ENCOUNTER — Encounter: Payer: Self-pay | Admitting: Family Medicine

## 2023-02-25 ENCOUNTER — Other Ambulatory Visit (HOSPITAL_COMMUNITY): Payer: Self-pay

## 2023-02-25 DIAGNOSIS — G43709 Chronic migraine without aura, not intractable, without status migrainosus: Secondary | ICD-10-CM | POA: Diagnosis not present

## 2023-02-25 MED ORDER — EMGALITY 120 MG/ML ~~LOC~~ SOAJ: 120.0000 mg | SUBCUTANEOUS | 3 refills | Status: AC

## 2023-02-28 ENCOUNTER — Ambulatory Visit: Payer: BC Managed Care – PPO | Admitting: Family Medicine

## 2023-02-28 ENCOUNTER — Ambulatory Visit: Payer: Self-pay | Admitting: Obstetrics and Gynecology

## 2023-02-28 NOTE — Telephone Encounter (Signed)
Patient has been moved to 03/01/23 at 8:20 am in person due to can't do vaginal exam virtually.

## 2023-02-28 NOTE — Telephone Encounter (Signed)
Chief Complaint: Vaginal pain Symptoms: vaginal pain, burning, itching, discharge Frequency: 2 days Pertinent Negatives: Patient denies fever Disposition: [] ED /[] Urgent Care (no appt availability in office) / [x] Appointment(In office/virtual)/ []  [facility]/ [] Home Care/ [] Refused Recommended Disposition /[] [place] [facility]/ []  Follow-up with PCP Additional Notes: Patient called in stating she was seen in office recently and prescribed antibiotics for a sinus infection. Patient has completed abx, but as of 2 days ago is having severe pain internally in vaginal area. Patient states pain is worsened when urinating or wiping. Patient has never had a UTI and cannot compare it to symptoms of UTI. Patient states she also has discharge and itching. Patient denies fever. Patient would like video evaluation asap. Scheduled for today.    Copied from CRM [id number]. Topic: Clinical - Red Word Triage >> Feb 28, 2023 12:13 PM Gurney Maxin H wrote: [facility] that prompted transfer to Nurse Triage: Patient thinks she has has a yeast infection,states she's having pain in vaginal area. Reason for Disposition  MODERATE-SEVERE itching (i.e., interferes with school, work, or sleep)  Answer Assessment - Initial Assessment Questions 1. SYMPTOM: "What's the main symptom you're concerned about?" (e.g., pain, itching, dryness)     Vaginal pain 2. LOCATION: "Where is the  pain located?" (e.g., inside/outside, left/right)     Inside - burning 3. ONSET: "When did the  pain  start?"     Two fever 4. PAIN: "Is there any pain?" If Yes, ask: "How bad is it?" (Scale: 1-10; mild, moderate, severe)   -  MILD (1-3): Doesn't interfere with normal activities.    -  MODERATE (4-7): Interferes with normal activities (e.g., work or school) or awakens from sleep.     -  SEVERE (8-10): Excruciating pain, unable to do any normal activities.     Painful when urinating 5. ITCHING: "Is there any itching?" If Yes, ask: "How  bad is it?" (Scale: 1-10; mild, moderate, severe)     5-6 6. CAUSE: "What do you think is causing the discharge?" "Have you had the same problem before? What happened then?"     Could be related to abx 7. OTHER SYMPTOMS: "Do you have any other symptoms?" (e.g., fever, itching, vaginal bleeding, pain with urination, injury to genital area, vaginal foreign body)     Itching, discharge, pain 8. PREGNANCY: "Is there any chance you are pregnant?" "When was your last menstrual period?"     No  Protocols used: Vaginal Symptoms-A-AH

## 2023-03-01 ENCOUNTER — Ambulatory Visit: Payer: BC Managed Care – PPO | Admitting: Family Medicine

## 2023-03-01 ENCOUNTER — Encounter: Payer: Self-pay | Admitting: Family Medicine

## 2023-03-01 VITALS — BP 120/88 | HR 94 | Temp 98.0°F | Ht 61.0 in | Wt 164.0 lb

## 2023-03-01 DIAGNOSIS — R0981 Nasal congestion: Secondary | ICD-10-CM | POA: Insufficient documentation

## 2023-03-01 DIAGNOSIS — B3731 Acute candidiasis of vulva and vagina: Secondary | ICD-10-CM | POA: Diagnosis not present

## 2023-03-01 MED ORDER — FLUCONAZOLE 150 MG PO TABS: 150.0000 mg | ORAL_TABLET | Freq: Once | ORAL | 0 refills | Status: AC

## 2023-03-01 NOTE — Assessment & Plan Note (Signed)
Acute, following antibiotics Complete 1 dose of Diflucan 150 mg p.o.  May repeat in 2 to 3 days if symptoms not improving as expected.Marland Kitchen

## 2023-03-01 NOTE — Progress Notes (Signed)
Patient ID: Danielle Saunders, female    DOB: [birth year], 43 y.o.   MRN: [id number]  This visit was conducted in person.  BP 120/88 (BP Location: Right Arm, Patient Position: Sitting, Cuff Size: Normal)   Pulse 94   Temp 98 F (36.7 C) (Temporal)   Ht 5\' 1"  (1.549 m)   Wt 164 lb (74.4 kg)   LMP 02/10/2023   SpO2 96%   BMI 30.99 kg/m    CC:  Chief Complaint  Patient presents with   Nasal Congestion    Treated for Sinus Infection 2/5 but still feels bad Blowing nose constantly   Vaginal Itching    Just finished antibiotics Also has discomfort when wiping    Subjective:   HPI: Danielle Saunders is a 43 y.o. female patient of Mayra Reel  presenting on 03/01/2023 for Nasal Congestion (Treated for Sinus Infection 2/5 but still feels bad/Blowing nose constantly) and Vaginal Itching (Just finished antibiotics/Also has discomfort when wiping)    Reviewed OV note from PCP on 02/20/2023 for acute sinusitis/... treated wth Augmentin x 7 days.. completed that 2 days ago.  She has had continuous symptoms  since early January... neg COVID and flu.  Report as continued nasal congestion, left sided face pain, left neck pain... Augmentin did seem to help with  left face pain... but still runny nose .Marland Kitchen Usually clear. No nose bleeds.  Occ sneeze.  No fever.  No SOB, no wheeze.  DAayquil, sudafed , mucinex   Vaginal itching started after antibitoics, small amount of thick white discrage, vaginal soreness with wiping.  No dysuria        Relevant past medical, surgical, family and social history reviewed and updated as indicated. Interim medical history since our last visit reviewed. Allergies and medications reviewed and updated. Outpatient Medications Prior to Visit  Medication Sig Dispense Refill   cholecalciferol (VITAMIN D3) 25 MCG (1000 UNIT) tablet Take 1,000 Units by mouth daily.     cyclobenzaprine (FLEXERIL) 10 MG tablet Take 1 tablet (10 mg total) by mouth 3 (three) times daily as  needed for muscle spasms. 90 tablet 3   Galcanezumab-gnlm (EMGALITY) 120 MG/ML SOAJ Inject 120 mg into the skin every 30 (thirty) days. 3 mL 3   ibuprofen (ADVIL) 800 MG tablet Take 1 tablet (800 mg total) by mouth every 8 (eight) hours as needed. 30 tablet 0   Magnesium 100 MG CAPS Take by mouth.     Multiple Vitamin (MULTIVITAMIN) tablet Take 1 tablet by mouth daily.     Probiotic Product (PROBIOTIC-10 PO) Take by mouth.     promethazine (PHENERGAN) 12.5 MG tablet Take 1-2 tablets (12.5-25 mg total) by mouth every 8 (eight) hours as needed for nausea or vomiting. 30 tablet 11   rizatriptan (MAXALT) 10 MG tablet Take 1 tablet by mouth at migraine onset.  May repeat in 2 hours if needed.  Do not exceed 2 tablets in 24 hours. 10 tablet 11   valACYclovir (VALTREX) 500 MG tablet TAKE ONE TABLET BY MOUTH EVERY 24 HOURS.  9   amitriptyline (ELAVIL) 50 MG tablet TAKE 1 TABLET BY MOUTH AT BEDTIME FOR HEADACHE PREVENTION. (Patient not taking: Reported on 02/20/2023) 90 tablet 1   amoxicillin-clavulanate (AUGMENTIN) 875-125 MG tablet Take 1 tablet by mouth 2 (two) times daily. 14 tablet 0   chlorpheniramine-HYDROcodone (TUSSIONEX) 10-8 MG/5ML Take 5 mLs by mouth every 12 (twelve) hours as needed for cough. 50 mL 0   No facility-administered medications prior  to visit.     Per HPI unless specifically indicated in ROS section below Review of Systems  Constitutional:  Negative for fatigue and fever.  HENT:  Positive for congestion.   Eyes:  Negative for pain.  Respiratory:  Negative for cough and shortness of breath.   Cardiovascular:  Negative for chest pain, palpitations and leg swelling.  Gastrointestinal:  Negative for abdominal pain.  Genitourinary:  Positive for vaginal discharge and vaginal pain. Negative for dysuria and vaginal bleeding.  Musculoskeletal:  Negative for back pain.  Neurological:  Negative for syncope, light-headedness and headaches.  Psychiatric/Behavioral:  Negative for  dysphoric mood.    Objective:  BP 120/88 (BP Location: Right Arm, Patient Position: Sitting, Cuff Size: Normal)   Pulse 94   Temp 98 F (36.7 C) (Temporal)   Ht 5\' 1"  (1.549 m)   Wt 164 lb (74.4 kg)   LMP 02/10/2023   SpO2 96%   BMI 30.99 kg/m   Wt Readings from Last 3 Encounters:  03/01/23 164 lb (74.4 kg)  02/20/23 164 lb (74.4 kg)  10/31/20 171 lb 6.4 oz (77.7 kg)      Physical Exam Constitutional:      General: She is not in acute distress.    Appearance: She is well-developed. She is not ill-appearing or toxic-appearing.  HENT:     Head: Normocephalic.     Right Ear: Hearing, tympanic membrane, ear canal and external ear normal. Tympanic membrane is not erythematous, retracted or bulging.     Left Ear: Hearing, tympanic membrane, ear canal and external ear normal. Tympanic membrane is not erythematous, retracted or bulging.     Nose: Mucosal edema and rhinorrhea present.     Right Turbinates: Not enlarged, swollen or pale.     Left Turbinates: Not enlarged, swollen or pale.     Right Sinus: No maxillary sinus tenderness or frontal sinus tenderness.     Left Sinus: No maxillary sinus tenderness or frontal sinus tenderness.     Comments: No nasal polyps visualized    Mouth/Throat:     Pharynx: Uvula midline.  Eyes:     General: Lids are normal. Lids are everted, no foreign bodies appreciated.     Conjunctiva/sclera: Conjunctivae normal.     Pupils: Pupils are equal, round, and reactive to light.  Neck:     Thyroid: No thyroid mass or thyromegaly.     Vascular: No carotid bruit.     Trachea: Trachea normal.  Cardiovascular:     Rate and Rhythm: Normal rate and regular rhythm.     Pulses: Normal pulses.     Heart sounds: Normal heart sounds, S1 normal and S2 normal. No murmur heard.    No friction rub. No gallop.  Pulmonary:     Effort: Pulmonary effort is normal. No tachypnea or respiratory distress.     Breath sounds: Normal breath sounds. No decreased breath  sounds, wheezing, rhonchi or rales.  Musculoskeletal:     Cervical back: Normal range of motion and neck supple.  Skin:    General: Skin is warm and dry.     Findings: No rash.  Neurological:     Mental Status: She is alert.  Psychiatric:        Mood and Affect: Mood is not anxious or depressed.        Speech: Speech normal.        Behavior: Behavior normal. Behavior is cooperative.        Judgment: Judgment normal.  Results for orders placed or performed in visit on 10/31/20  TSH   Collection Time: 10/31/20  9:48 AM  Result Value Ref Range   TSH 2.660 0.450 - 4.500 uIU/mL  CBC with Differential/Platelets   Collection Time: 10/31/20  9:48 AM  Result Value Ref Range   WBC 6.9 3.4 - 10.8 x10E3/uL   RBC 4.72 3.77 - 5.28 x10E6/uL   Hemoglobin 13.6 11.1 - 15.9 g/dL   Hematocrit 16.1 09.6 - 46.6 %   MCV 86 79 - 97 fL   MCH 28.8 26.6 - 33.0 pg   MCHC 33.6 31.5 - 35.7 g/dL   RDW 04.5 40.9 - 81.1 %   Platelets 315 150 - 450 x10E3/uL   Neutrophils 57 Not Estab. %   Lymphs 33 Not Estab. %   Monocytes 6 Not Estab. %   Eos 3 Not Estab. %   Basos 1 Not Estab. %   Neutrophils Absolute 4.0 1.4 - 7.0 x10E3/uL   Lymphocytes Absolute 2.3 0.7 - 3.1 x10E3/uL   Monocytes Absolute 0.4 0.1 - 0.9 x10E3/uL   EOS (ABSOLUTE) 0.2 0.0 - 0.4 x10E3/uL   Basophils Absolute 0.1 0.0 - 0.2 x10E3/uL   Immature Granulocytes 0 Not Estab. %   Immature Grans (Abs) 0.0 0.0 - 0.1 x10E3/uL  Comprehensive metabolic panel   Collection Time: 10/31/20  9:48 AM  Result Value Ref Range   Glucose 85 70 - 99 mg/dL   BUN 8 6 - 20 mg/dL   Creatinine, Ser 9.14 0.57 - 1.00 mg/dL   eGFR 91 >78 GN/FAO/1.30   BUN/Creatinine Ratio 10 9 - 23   Sodium 140 134 - 144 mmol/L   Potassium 4.2 3.5 - 5.2 mmol/L   Chloride 104 96 - 106 mmol/L   CO2 21 20 - 29 mmol/L   Calcium 9.2 8.7 - 10.2 mg/dL   Total Protein 6.4 6.0 - 8.5 g/dL   Albumin 4.1 3.8 - 4.8 g/dL   Globulin, Total 2.3 1.5 - 4.5 g/dL   Albumin/Globulin Ratio  1.8 1.2 - 2.2   Bilirubin Total 0.3 0.0 - 1.2 mg/dL   Alkaline Phosphatase 83 44 - 121 IU/L   AST 13 0 - 40 IU/L   ALT 12 0 - 32 IU/L    Assessment and Plan  Candida vaginitis Assessment & Plan:  Acute, following antibiotics Complete 1 dose of Diflucan 150 mg p.o.  May repeat in 2 to 3 days if symptoms not improving as expected..    Nasal congestion Assessment & Plan: Acute, persistent.  Significant improvement in sinusitis symptoms and facial pain with Augmentin, but continued nasal congestion.  Potential nonpollen environmental allergies as underlying cause of congestion.  No clear sign of continued bacterial infection on exam. Will encourage patient to give his symptoms more time as well as using nasal saline to flush out sinuses, followed by Flonase 2 sprays per nostril daily to decrease nasal inflammation.  She can also add Zyrtec at bedtime to cover possible allergy underlying. Return and ER precautions provided.  She will follow-up if it is not improving over the next 2 weeks.   Other orders -     Fluconazole; Take 1 tablet (150 mg total) by mouth once for 1 dose. Repeat 2-3 days after if not improved.  Dispense: 2 tablet; Refill: 0    No follow-ups on file.   Kerby Nora, MD

## 2023-03-01 NOTE — Assessment & Plan Note (Signed)
Acute, persistent.  Significant improvement in sinusitis symptoms and facial pain with Augmentin, but continued nasal congestion.  Potential nonpollen environmental allergies as underlying cause of congestion.  No clear sign of continued bacterial infection on exam. Will encourage patient to give his symptoms more time as well as using nasal saline to flush out sinuses, followed by Flonase 2 sprays per nostril daily to decrease nasal inflammation.  She can also add Zyrtec at bedtime to cover possible allergy underlying. Return and ER precautions provided.  She will follow-up if it is not improving over the next 2 weeks.

## 2023-03-01 NOTE — Patient Instructions (Signed)
Start nasal saline, followed by flonase 2 spray  2 sprays  per nostril.  Start zyrtrec at bedtime.  Call if not improving 1-2 weeks.  Call if facial pain recurs or fever... go to ER if shortness of breath.

## 2023-03-06 ENCOUNTER — Other Ambulatory Visit (HOSPITAL_COMMUNITY): Payer: Self-pay

## 2023-03-06 ENCOUNTER — Telehealth: Payer: Self-pay

## 2023-03-06 NOTE — Telephone Encounter (Signed)
*  [facility]  Pharmacy Patient Advocate Encounter   Received notification from CoverMyMeds that prior authorization for Emgality 120MG /ML auto-injectors (migraine)  is required/requested.   Insurance verification completed.   The patient is insured through [facility] .   Per test claim: PA required; PA submitted to above mentioned insurance via CoverMyMeds Key/confirmation #/EOC [facility] Status is pending

## 2023-03-07 ENCOUNTER — Other Ambulatory Visit (HOSPITAL_COMMUNITY): Payer: Self-pay

## 2023-03-07 NOTE — Telephone Encounter (Signed)
Pharmacy Patient Advocate Encounter  Received notification from [facility] that Prior Authorization for Emgality 120MG /ML auto-injectors (migraine) has been APPROVED from 03/06/2023 to 03/05/2024. Ran test claim, Copay is $35.00. This test claim was processed through [facility]- copay amounts may vary at other pharmacies due to pharmacy/plan contracts, or as the patient moves through the different stages of their insurance plan.   PA #/Case ID/Reference #: [id number]

## 2023-03-18 ENCOUNTER — Other Ambulatory Visit: Payer: Self-pay | Admitting: Obstetrics and Gynecology

## 2023-03-18 DIAGNOSIS — Z1231 Encounter for screening mammogram for malignant neoplasm of breast: Secondary | ICD-10-CM

## 2023-04-02 ENCOUNTER — Ambulatory Visit
Admission: RE | Admit: 2023-04-02 | Discharge: 2023-04-02 | Disposition: A | Discharge status: Home / Self Care | Source: Ambulatory Visit | Attending: Obstetrics and Gynecology | Admitting: Obstetrics and Gynecology

## 2023-04-02 DIAGNOSIS — Z1231 Encounter for screening mammogram for malignant neoplasm of breast: Secondary | ICD-10-CM | POA: Diagnosis not present

## 2023-04-04 ENCOUNTER — Encounter: Admitting: Primary Care

## 2023-04-11 ENCOUNTER — Encounter: Admitting: Primary Care

## 2023-04-26 DIAGNOSIS — Z1329 Encounter for screening for other suspected endocrine disorder: Secondary | ICD-10-CM | POA: Diagnosis not present

## 2023-04-26 DIAGNOSIS — Z131 Encounter for screening for diabetes mellitus: Secondary | ICD-10-CM | POA: Diagnosis not present

## 2023-04-26 DIAGNOSIS — Z6831 Body mass index (BMI) 31.0-31.9, adult: Secondary | ICD-10-CM | POA: Diagnosis not present

## 2023-04-26 DIAGNOSIS — N951 Menopausal and female climacteric states: Secondary | ICD-10-CM | POA: Diagnosis not present

## 2023-04-26 DIAGNOSIS — E782 Mixed hyperlipidemia: Secondary | ICD-10-CM | POA: Diagnosis not present

## 2023-04-26 DIAGNOSIS — E559 Vitamin D deficiency, unspecified: Secondary | ICD-10-CM | POA: Diagnosis not present

## 2023-04-26 DIAGNOSIS — G43109 Migraine with aura, not intractable, without status migrainosus: Secondary | ICD-10-CM | POA: Diagnosis not present

## 2023-04-26 DIAGNOSIS — Z130 Encounter for screening for diseases of the blood and blood-forming organs and certain disorders involving the immune mechanism: Secondary | ICD-10-CM | POA: Diagnosis not present

## 2023-04-26 DIAGNOSIS — R6882 Decreased libido: Secondary | ICD-10-CM | POA: Diagnosis not present

## 2023-04-30 ENCOUNTER — Encounter: Payer: Self-pay | Admitting: Primary Care

## 2023-04-30 ENCOUNTER — Ambulatory Visit (INDEPENDENT_AMBULATORY_CARE_PROVIDER_SITE_OTHER): Admitting: Primary Care

## 2023-04-30 VITALS — BP 124/86 | HR 79 | Temp 97.2°F | Ht 61.0 in | Wt 167.0 lb

## 2023-04-30 DIAGNOSIS — R0981 Nasal congestion: Secondary | ICD-10-CM | POA: Diagnosis not present

## 2023-04-30 DIAGNOSIS — G43009 Migraine without aura, not intractable, without status migrainosus: Secondary | ICD-10-CM | POA: Diagnosis not present

## 2023-04-30 DIAGNOSIS — A6000 Herpesviral infection of urogenital system, unspecified: Secondary | ICD-10-CM | POA: Diagnosis not present

## 2023-04-30 DIAGNOSIS — Z0000 Encounter for general adult medical examination without abnormal findings: Secondary | ICD-10-CM

## 2023-04-30 MED ORDER — PREDNISONE 20 MG PO TABS: ORAL_TABLET | ORAL | 0 refills | Status: DC

## 2023-04-30 NOTE — Progress Notes (Signed)
 Subjective:    Patient ID: Danielle Saunders, female    DOB: [birth year], 43 y.o.   MRN: [id number]  HPI  Danielle Saunders is a very pleasant 43 y.o. female who presents today for complete physical and follow up of chronic conditions.  She continues to experience left sided nasal congestion, chronic for at least 3 months. She has been taking Zyrtec and Flonase without improvement. She denies ear pain, post nasal drip, cough, right sided nasal congestion.   Immunizations: -Tetanus: Completed in 2017  Diet: Fair diet.  Exercise: No regular exercise.  Eye exam: Completes annually  Dental exam: Completes semi-annually    Pap Smear: Completed per GYN and is UTD Mammogram: Completed in March 2025   BP Readings from Last 3 Encounters:  04/30/23 124/86  03/01/23 120/88  02/20/23 106/78      Review of Systems  Constitutional:  Negative for unexpected weight change.  HENT:  Negative for rhinorrhea.   Respiratory:  Negative for cough and shortness of breath.   Cardiovascular:  Negative for chest pain.  Gastrointestinal:  Negative for constipation and diarrhea.  Genitourinary:  Negative for difficulty urinating.  Musculoskeletal:  Negative for arthralgias and myalgias.  Skin:  Negative for rash.  Allergic/Immunologic: Negative for environmental allergies.  Neurological:  Negative for dizziness, numbness and headaches.  Psychiatric/Behavioral:  The patient is not nervous/anxious.          Past Medical History:  Diagnosis Date   CERVICAL DYSPLASIA 03/14/2006   Qualifier: History of   By: Burnadette Pop  MD, Trisha Mangle      Replacing diagnoses that were inactivated after the 04/16/22 regulatory import     Chronic tension headaches    Chronic tension-type headache, not intractable 10/27/2014   DEPRESSIVE DISORDER, NOS 03/14/2006   Qualifier: Diagnosis of  By: Knox Royalty     Genital herpes    PONV (postoperative nausea and vomiting)     Social History   Socioeconomic History   Marital  status: Married    Spouse name: Not on file   Number of children: Not on file   Years of education: Not on file   Highest education level: Not on file  Occupational History   Not on file  Tobacco Use   Smoking status: Never   Smokeless tobacco: Never  Vaping Use   Vaping status: Never Used  Substance and Sexual Activity   Alcohol use: No    Alcohol/week: 0.0 standard drinks of alcohol   Drug use: Not on file   Sexual activity: Not on file    Comment: Tuberligation  Other Topics Concern   Not on file  Social History Narrative   Married.   2 children.   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    Enjoys relaxing and spending time with her family.    Social Drivers of Corporate investment banker Strain: Not on file  Food Insecurity: Not on file  Transportation Needs: Not on file  Physical Activity: Not on file  Stress: Not on file  Social Connections: Not on file  Intimate Partner Violence: Not on file    Past Surgical History:  Procedure Laterality Date   ABDOMINOPLASTY     AUGMENTATION MAMMAPLASTY Bilateral 2017   BREAST ENHANCEMENT SURGERY     IUD REMOVAL N/A 02/11/2019   Procedure: INTRAUTERINE DEVICE (IUD) REMOVAL;  Surgeon: Gerald Leitz, MD;  Location: [facility];  Service: Gynecology;  Laterality: N/A;   LAPAROSCOPIC TUBAL LIGATION Bilateral 02/11/2019   Procedure:  LAPAROSCOPIC TUBAL LIGATION;  Surgeon: Arlee Lace, MD;  Location: [facility];  Service: Gynecology;  Laterality: Bilateral;    Family History  Problem Relation Age of Onset   Hyperlipidemia Father    Migraines Paternal Aunt     No Known Allergies  Current Outpatient Medications on File Prior to Visit  Medication Sig Dispense Refill   cholecalciferol (VITAMIN D3) 25 MCG (1000 UNIT) tablet Take 1,000 Units by mouth daily.     cyclobenzaprine (FLEXERIL) 10 MG tablet Take 1 tablet (10 mg total) by mouth 3 (three) times daily as needed for muscle spasms. 90 tablet 3    Galcanezumab-gnlm (EMGALITY) 120 MG/ML SOAJ Inject 120 mg into the skin every 30 (thirty) days. 3 mL 3   ibuprofen (ADVIL) 800 MG tablet Take 1 tablet (800 mg total) by mouth every 8 (eight) hours as needed. 30 tablet 0   Magnesium 100 MG CAPS Take by mouth.     Multiple Vitamin (MULTIVITAMIN) tablet Take 1 tablet by mouth daily.     Probiotic Product (PROBIOTIC-10 PO) Take by mouth.     promethazine (PHENERGAN) 12.5 MG tablet Take 1-2 tablets (12.5-25 mg total) by mouth every 8 (eight) hours as needed for nausea or vomiting. 30 tablet 11   valACYclovir (VALTREX) 500 MG tablet TAKE ONE TABLET BY MOUTH EVERY 24 HOURS.  9   No current facility-administered medications on file prior to visit.    BP 124/86   Pulse 79   Temp (!) 97.2 F (36.2 C) (Temporal)   Ht 5\' 1"  (1.549 m)   Wt 167 lb (75.8 kg)   LMP 04/29/2023   SpO2 96%   BMI 31.55 kg/m  Objective:   Physical Exam HENT:     Right Ear: Tympanic membrane and ear canal normal.     Left Ear: Tympanic membrane and ear canal normal.  Eyes:     Pupils: Pupils are equal, round, and reactive to light.  Cardiovascular:     Rate and Rhythm: Normal rate and regular rhythm.  Pulmonary:     Effort: Pulmonary effort is normal.     Breath sounds: Normal breath sounds.  Abdominal:     General: Bowel sounds are normal.     Palpations: Abdomen is soft.     Tenderness: There is no abdominal tenderness.  Musculoskeletal:        General: Normal range of motion.     Cervical back: Neck supple.  Skin:    General: Skin is warm and dry.  Neurological:     Mental Status: She is alert and oriented to person, place, and time.     Cranial Nerves: No cranial nerve deficit.     Deep Tendon Reflexes:     Reflex Scores:      Patellar reflexes are 2+ on the right side and 2+ on the left side. Psychiatric:        Mood and Affect: Mood normal.           Assessment & Plan:  Preventative health care Assessment & Plan: Immunizations UTD. Pap  smear UTD. Follows with GYN Mammogram UTD.  Discussed the importance of a healthy diet and regular exercise in order for weight loss, and to reduce the risk of further co-morbidity.  Exam stable. Labs pending.  Follow up in 1 year for repeat physical.    Migraine without aura and without status migrainosus, not intractable Assessment & Plan: Controlled. Following with neurology.   Continue Emgality 120 mg weekly. Continue  promethazine 12.5 mg PRN and Flexeril 10 mg PRN.   Genital herpes simplex, unspecified site Assessment & Plan: No recent outbreaks. Continue Valtrex 500 mg daily per GYN   Nasal congestion Assessment & Plan: Chronic and continued.  Continue Flonase, Zyrtec. Start prednisone 20 mg tablets. Take 2 tablets by mouth once daily in the morning for 5 days.  Consider switching to Xyzal or prescription treatment.   Orders: -     predniSONE; Take 2 tablets by mouth once daily in the morning for 5 days.  Dispense: 10 tablet; Refill: 0        Gabriel John, NP

## 2023-04-30 NOTE — Patient Instructions (Signed)
 Start prednisone 20 mg tablets. Take 2 tablets by mouth once daily in the morning for 5 days.  Please keep me updated!  It was a pleasure to see you today!

## 2023-04-30 NOTE — Assessment & Plan Note (Signed)
 Controlled. Following with neurology.   Continue Emgality 120 mg weekly. Continue promethazine 12.5 mg PRN and Flexeril 10 mg PRN.

## 2023-04-30 NOTE — Assessment & Plan Note (Signed)
 No recent outbreaks. Continue Valtrex 500 mg daily per GYN

## 2023-04-30 NOTE — Assessment & Plan Note (Signed)
 Chronic and continued.  Continue Flonase, Zyrtec. Start prednisone 20 mg tablets. Take 2 tablets by mouth once daily in the morning for 5 days.  Consider switching to Xyzal or prescription treatment.

## 2023-04-30 NOTE — Assessment & Plan Note (Signed)
Immunizations UTD. Pap smear UTD. Follows with GYN Mammogram UTD  Discussed the importance of a healthy diet and regular exercise in order for weight loss, and to reduce the risk of further co-morbidity.  Exam stable. Labs pending.  Follow up in 1 year for repeat physical.

## 2023-05-13 DIAGNOSIS — R0981 Nasal congestion: Secondary | ICD-10-CM

## 2023-05-14 MED ORDER — IPRATROPIUM BROMIDE 0.03 % NA SOLN: 2.0000 | Freq: Two times a day (BID) | NASAL | 0 refills | Status: DC

## 2023-05-14 MED ORDER — LEVOCETIRIZINE DIHYDROCHLORIDE 5 MG PO TABS: 5.0000 mg | ORAL_TABLET | Freq: Every evening | ORAL | 0 refills | Status: DC

## 2023-05-21 DIAGNOSIS — N951 Menopausal and female climacteric states: Secondary | ICD-10-CM | POA: Diagnosis not present

## 2023-05-21 DIAGNOSIS — Z1331 Encounter for screening for depression: Secondary | ICD-10-CM | POA: Diagnosis not present

## 2023-05-21 DIAGNOSIS — R7989 Other specified abnormal findings of blood chemistry: Secondary | ICD-10-CM | POA: Diagnosis not present

## 2023-05-21 DIAGNOSIS — E669 Obesity, unspecified: Secondary | ICD-10-CM | POA: Diagnosis not present

## 2023-05-21 DIAGNOSIS — E782 Mixed hyperlipidemia: Secondary | ICD-10-CM | POA: Diagnosis not present

## 2023-05-29 DIAGNOSIS — Z6831 Body mass index (BMI) 31.0-31.9, adult: Secondary | ICD-10-CM | POA: Diagnosis not present

## 2023-05-29 DIAGNOSIS — G479 Sleep disorder, unspecified: Secondary | ICD-10-CM | POA: Diagnosis not present

## 2023-06-05 ENCOUNTER — Other Ambulatory Visit: Payer: Self-pay | Admitting: Primary Care

## 2023-06-05 DIAGNOSIS — R0981 Nasal congestion: Secondary | ICD-10-CM

## 2023-06-12 DIAGNOSIS — E782 Mixed hyperlipidemia: Secondary | ICD-10-CM | POA: Diagnosis not present

## 2023-06-13 MED ORDER — CARBINOXAMINE MALEATE 4 MG PO TABS: 4.0000 mg | ORAL_TABLET | Freq: Two times a day (BID) | ORAL | 0 refills | Status: AC

## 2023-07-07 ENCOUNTER — Other Ambulatory Visit: Payer: Self-pay | Admitting: Primary Care

## 2023-07-07 DIAGNOSIS — R0981 Nasal congestion: Secondary | ICD-10-CM

## 2023-07-08 ENCOUNTER — Other Ambulatory Visit: Payer: Self-pay | Admitting: Primary Care

## 2023-07-08 DIAGNOSIS — R0981 Nasal congestion: Secondary | ICD-10-CM

## 2023-07-16 ENCOUNTER — Encounter (INDEPENDENT_AMBULATORY_CARE_PROVIDER_SITE_OTHER): Payer: Self-pay

## 2023-07-16 DIAGNOSIS — E785 Hyperlipidemia, unspecified: Secondary | ICD-10-CM

## 2023-07-16 DIAGNOSIS — R9389 Abnormal findings on diagnostic imaging of other specified body structures: Secondary | ICD-10-CM

## 2023-07-18 NOTE — Telephone Encounter (Signed)

## 2023-07-23 ENCOUNTER — Ambulatory Visit (INDEPENDENT_AMBULATORY_CARE_PROVIDER_SITE_OTHER)
Admission: RE | Admit: 2023-07-23 | Discharge: 2023-07-23 | Disposition: A | Discharge status: Home / Self Care | Source: Ambulatory Visit | Attending: Primary Care | Admitting: Primary Care

## 2023-07-23 DIAGNOSIS — R9389 Abnormal findings on diagnostic imaging of other specified body structures: Secondary | ICD-10-CM

## 2023-07-23 DIAGNOSIS — M47812 Spondylosis without myelopathy or radiculopathy, cervical region: Secondary | ICD-10-CM | POA: Diagnosis not present

## 2023-07-24 ENCOUNTER — Ambulatory Visit: Payer: Self-pay | Admitting: Primary Care

## 2023-08-01 DIAGNOSIS — M9901 Segmental and somatic dysfunction of cervical region: Secondary | ICD-10-CM | POA: Diagnosis not present

## 2023-08-01 DIAGNOSIS — M9902 Segmental and somatic dysfunction of thoracic region: Secondary | ICD-10-CM | POA: Diagnosis not present

## 2023-08-01 DIAGNOSIS — M531 Cervicobrachial syndrome: Secondary | ICD-10-CM | POA: Diagnosis not present

## 2023-08-08 DIAGNOSIS — M9901 Segmental and somatic dysfunction of cervical region: Secondary | ICD-10-CM | POA: Diagnosis not present

## 2023-08-08 DIAGNOSIS — M531 Cervicobrachial syndrome: Secondary | ICD-10-CM | POA: Diagnosis not present

## 2023-08-13 ENCOUNTER — Other Ambulatory Visit (HOSPITAL_COMMUNITY)
Admission: RE | Admit: 2023-08-13 | Discharge: 2023-08-13 | Disposition: A | Discharge status: Home / Self Care | Source: Ambulatory Visit | Attending: Obstetrics and Gynecology | Admitting: Obstetrics and Gynecology

## 2023-08-13 DIAGNOSIS — Z01419 Encounter for gynecological examination (general) (routine) without abnormal findings: Secondary | ICD-10-CM | POA: Diagnosis not present

## 2023-08-13 DIAGNOSIS — Z124 Encounter for screening for malignant neoplasm of cervix: Secondary | ICD-10-CM | POA: Diagnosis not present

## 2023-08-15 DIAGNOSIS — M9901 Segmental and somatic dysfunction of cervical region: Secondary | ICD-10-CM | POA: Diagnosis not present

## 2023-08-15 DIAGNOSIS — M531 Cervicobrachial syndrome: Secondary | ICD-10-CM | POA: Diagnosis not present

## 2023-08-18 LAB — CYTOLOGY - PAP
Comment: NEGATIVE
Diagnosis: NEGATIVE
High risk HPV: NEGATIVE

## 2023-08-27 DIAGNOSIS — M9901 Segmental and somatic dysfunction of cervical region: Secondary | ICD-10-CM | POA: Diagnosis not present

## 2023-08-27 DIAGNOSIS — M531 Cervicobrachial syndrome: Secondary | ICD-10-CM | POA: Diagnosis not present

## 2023-09-10 DIAGNOSIS — M531 Cervicobrachial syndrome: Secondary | ICD-10-CM | POA: Diagnosis not present

## 2023-09-10 DIAGNOSIS — M9901 Segmental and somatic dysfunction of cervical region: Secondary | ICD-10-CM | POA: Diagnosis not present

## 2023-09-24 DIAGNOSIS — M9901 Segmental and somatic dysfunction of cervical region: Secondary | ICD-10-CM | POA: Diagnosis not present

## 2023-09-24 DIAGNOSIS — M531 Cervicobrachial syndrome: Secondary | ICD-10-CM | POA: Diagnosis not present

## 2023-10-01 ENCOUNTER — Ambulatory Visit (INDEPENDENT_AMBULATORY_CARE_PROVIDER_SITE_OTHER): Admitting: Otolaryngology

## 2023-10-01 ENCOUNTER — Encounter (INDEPENDENT_AMBULATORY_CARE_PROVIDER_SITE_OTHER): Payer: Self-pay | Admitting: Otolaryngology

## 2023-10-01 VITALS — BP 124/85 | HR 78 | Ht 61.0 in | Wt 175.0 lb

## 2023-10-01 DIAGNOSIS — J342 Deviated nasal septum: Secondary | ICD-10-CM | POA: Diagnosis not present

## 2023-10-01 DIAGNOSIS — J343 Hypertrophy of nasal turbinates: Secondary | ICD-10-CM | POA: Diagnosis not present

## 2023-10-01 DIAGNOSIS — R0981 Nasal congestion: Secondary | ICD-10-CM | POA: Diagnosis not present

## 2023-10-01 DIAGNOSIS — J339 Nasal polyp, unspecified: Secondary | ICD-10-CM

## 2023-10-01 DIAGNOSIS — J3489 Other specified disorders of nose and nasal sinuses: Secondary | ICD-10-CM | POA: Diagnosis not present

## 2023-10-01 MED ORDER — AZELASTINE HCL 0.1 % NA SOLN: 2.0000 | Freq: Two times a day (BID) | NASAL | 12 refills | Status: AC

## 2023-10-01 MED ORDER — FLUTICASONE PROPIONATE 50 MCG/ACT NA SUSP: 2.0000 | Freq: Two times a day (BID) | NASAL | 6 refills | Status: AC

## 2023-10-01 MED ORDER — PREDNISONE 10 MG PO TABS: ORAL_TABLET | ORAL | 0 refills | Status: AC

## 2023-10-01 NOTE — Progress Notes (Signed)
 Dear Dr. Gretta, Here is my assessment for our mutual patient, Danielle Saunders. Thank you for allowing me the opportunity to care for your patient. Please do not hesitate to contact me should you have any other questions. Sincerely, Dr. Eldora Blanch  Otolaryngology Clinic Note  HISTORY: Danielle Saunders is a 43 y.o. female kindly referred by Dr. Gretta for evaluation of nasal congestion  Initial visit (09/2023): She reports that her main problem is left sided nasal congestion - feels like I can't even breathe out of left side - going on for over a year. Started around December, denies antecedent URI. No discolored drainage, no facial pressure, mostly just clear thick drainage, anterior rhinorrhea. No PND. Sense of smell off a little bit. She went to go see her PCP in Feb and was diagnosed with sinusitis and prescribed Augmentin . Did not help. She also got prednisone , helped temporarily but symptoms came right back. She has tried flonase , atrovent , and zyrtec as well. She tried all of this for over a month but did not help. Not using afrin currently but used in the remote past.  Does not typically have problems with sinus infections -- do not even get them once a year  No prior nasal trauma.   Symptom severity is severe.  Improvement occurred with prednisone  and sprays (very temporary). Very mild typical AR symptoms - sneezing, itching.  Allergy testing has not been done. No previous sinonasal surgery.  She is currently using no nasal medications.  GLP-1: no AP/AC: no  Tobacco: no  PMHx: Migraines  RADIOGRAPHIC EVALUATION AND INDEPENDENT REVIEW OF OTHER RECORDS:: Comer Gretta (02/20/2023): noted cough, congestion, rhinorrhea, PND. Left nasal congestion, b/l max pressure. Tried dayquil, mucinex, afrin without improvement; Dx: Sinusitis; Rx: augmentin  Dr. Avelina (03/01/2023): improvement in sinusitis but persistent congestion; Rx: flonase , zyrtec 05/13/2023: taking prednisone  but now recurrent  congestion; stopping flonase  and zyrtec, now on atrovent  07/07/2023: still congested on left, Dx: Nasal congestion; Rx: ref to ENT Amy lomax 02/25/2023): migraine meds, all PRN; migraines well managed reportedly Labs CBC and CMP 10/31/2020: WBC 6.9,  Eos 200; BUN/Cr 8/0.84 CTH 10/12/2015 independently interpreted: limited evaluation shows visualized frontal and sphenoid sinuses clear Past Medical History:  Diagnosis Date   CERVICAL DYSPLASIA 03/14/2006   Qualifier: History of   By: Alla  MD, Alda      Replacing diagnoses that were inactivated after the 04/16/22 regulatory import     Chronic tension headaches    Chronic tension-type headache, not intractable 10/27/2014   DEPRESSIVE DISORDER, NOS 03/14/2006   Qualifier: Diagnosis of  By: Manford Longs     Genital herpes    PONV (postoperative nausea and vomiting)    Past Surgical History:  Procedure Laterality Date   ABDOMINOPLASTY     AUGMENTATION MAMMAPLASTY Bilateral 2017   BREAST ENHANCEMENT SURGERY     IUD REMOVAL N/A 02/11/2019   Procedure: INTRAUTERINE DEVICE (IUD) REMOVAL;  Surgeon: Rosalva Sawyer, MD;  Location: [facility];  Service: Gynecology;  Laterality: N/A;   LAPAROSCOPIC TUBAL LIGATION Bilateral 02/11/2019   Procedure: LAPAROSCOPIC TUBAL LIGATION;  Surgeon: Rosalva Sawyer, MD;  Location: [facility];  Service: Gynecology;  Laterality: Bilateral;   Family History  Problem Relation Age of Onset   Hyperlipidemia Father    Migraines Paternal Aunt    Social History   Tobacco Use   Smoking status: Never   Smokeless tobacco: Never  Substance Use Topics   Alcohol use: No    Alcohol/week: 0.0 standard drinks of  alcohol   No Known Allergies Current Outpatient Medications  Medication Sig Dispense Refill   azelastine  (ASTELIN ) 0.1 % nasal spray Place 2 sprays into both nostrils 2 (two) times daily. Use in each nostril as directed 30 mL 12   cholecalciferol (VITAMIN D3) 25 MCG (1000 UNIT) tablet Take  1,000 Units by mouth daily.     cyclobenzaprine  (FLEXERIL ) 10 MG tablet Take 1 tablet (10 mg total) by mouth 3 (three) times daily as needed for muscle spasms. 90 tablet 3   fluticasone  (FLONASE ) 50 MCG/ACT nasal spray Place 2 sprays into both nostrils in the morning and at bedtime. 16 g 6   Galcanezumab -gnlm (EMGALITY ) 120 MG/ML SOAJ Inject 120 mg into the skin every 30 (thirty) days. 3 mL 3   ibuprofen  (ADVIL ) 800 MG tablet Take 1 tablet (800 mg total) by mouth every 8 (eight) hours as needed. 30 tablet 0   Magnesium 100 MG CAPS Take by mouth.     Multiple Vitamin (MULTIVITAMIN) tablet Take 1 tablet by mouth daily.     predniSONE  (DELTASONE ) 10 MG tablet Take 3 tablets (30 mg total) by mouth daily with breakfast for 4 days, THEN 2 tablets (20 mg total) daily with breakfast for 4 days, THEN 1 tablet (10 mg total) daily with breakfast for 4 days. 24 tablet 0   Probiotic Product (PROBIOTIC-10 PO) Take by mouth.     promethazine  (PHENERGAN ) 12.5 MG tablet Take 1-2 tablets (12.5-25 mg total) by mouth every 8 (eight) hours as needed for nausea or vomiting. 30 tablet 11   valACYclovir (VALTREX) 500 MG tablet TAKE ONE TABLET BY MOUTH EVERY 24 HOURS.  9   Carbinoxamine  Maleate 4 MG TABS Take 1 tablet (4 mg total) by mouth 2 (two) times daily. For allergies. (Patient not taking: Reported on 10/01/2023) 60 tablet 0   ipratropium (ATROVENT ) 0.03 % nasal spray PLACE 2 SPRAYS INTO BOTH NOSTRILS EVERY 12 (TWELVE) HOURS. (Patient not taking: Reported on 10/01/2023) 30 mL 0   No current facility-administered medications for this visit.   BP 124/85 (BP Location: Left Arm, Patient Position: Sitting, Cuff Size: Large)   Pulse 78   Ht 5' 1 (1.549 m)   Wt 175 lb (79.4 kg)   SpO2 92%   BMI 33.07 kg/m   PHYSICAL EXAM:  BP 124/85 (BP Location: Left Arm, Patient Position: Sitting, Cuff Size: Large)   Pulse 78   Ht 5' 1 (1.549 m)   Wt 175 lb (79.4 kg)   SpO2 92%   BMI 33.07 kg/m    Salient findings:  CN  II-XII intact Bilateral EAC clear and TM intact with well pneumatized middle ear spaces Nose: Anterior rhinoscopy reveals septum deviates left, bilateral inferior turbinate hypertrophy.  Nasal endoscopy was indicated to better evaluate the nose and paranasal sinuses, given the patient's history and exam findings, and is detailed below. No lesions of oral cavity/oropharynx; dentition fair No respiratory distress or stridor   PROCEDURE:  Prior to initiating any procedures, risks/benefits/alternatives were explained to the patient and verbal consent obtained. Diagnostic Nasal Endoscopy Pre-procedure diagnosis: Concern for sinusitis, left nasal congestion, rule out lesion Post-procedure diagnosis: same Indication: See pre-procedure diagnosis and physical exam above Complications: None apparent EBL: 0 mL Anesthesia: Lidocaine  4% and topical decongestant was topically sprayed in each nasal cavity  Description of Procedure:  Patient was identified. A rigid 30 degree endoscope was utilized to evaluate the sinonasal cavities, mucosa, sinus ostia and turbinates and septum.  Overall, signs of mucosal inflammation are  noted - modest mucosal edema diffusely left with retained mucoid secretions and polypoid middle turbinate.  No mucopurulence, polyps, or masses noted.   Right Middle meatus: fair amount of mucoid secretions Right SE Recess: clear Left MM: fair amount of mucoid secretions Left SE Recess: clear   Photodocumentation was obtained.  CPT CODE -- 31231 - Mod 25   ASSESSMENT:  43 y.o. with:  1. Nasal polyposis   2. Nasal congestion   3. Hypertrophy of both inferior nasal turbinates   4. Nasal obstruction   5. Nasal septal deviation    Noted significant left nasal congestion after sinusitis. Polypoid middle turbinate on left. Central compartment? But no h/o allergies. Given her significant symptoms, do think CT would be useful here. She has already tried medical therapy but will  continue and do round of steroids  PLAN: We've discussed issues and options today.  We reviewed the nasal endoscopy images together.  The risks, benefits and alternatives were discussed and questions answered.  She has elected to proceed with:  1) Flonase  and astelin  BID 2) Daily sinus rinses 3) CT Sinus 4) f/u in 4 weeks, sooner as needed; consider septo/turbs, MT mass excision at that time  See below regarding exact medications prescribed this encounter including dosages and route: Meds ordered this encounter  Medications   predniSONE  (DELTASONE ) 10 MG tablet    Sig: Take 3 tablets (30 mg total) by mouth daily with breakfast for 4 days, THEN 2 tablets (20 mg total) daily with breakfast for 4 days, THEN 1 tablet (10 mg total) daily with breakfast for 4 days.    Dispense:  24 tablet    Refill:  0   azelastine  (ASTELIN ) 0.1 % nasal spray    Sig: Place 2 sprays into both nostrils 2 (two) times daily. Use in each nostril as directed    Dispense:  30 mL    Refill:  12   fluticasone  (FLONASE ) 50 MCG/ACT nasal spray    Sig: Place 2 sprays into both nostrils in the morning and at bedtime.    Dispense:  16 g    Refill:  6     Thank you for allowing me the opportunity to care for your patient. Please do not hesitate to contact me should you have any other questions.  Sincerely, Eldora Blanch, MD Otolaryngologist (ENT), [facility] Phone: [phone] Fax: [phone]  MDM:  Level 4: [phone] Complexity/Problems addressed: mod - chronic problems, worsening Data complexity: high - independent interpretation of imaging; review of notes, labs, ordering test - Morbidity: mod  - Prescription Drug prescribed or managed: y  10/01/2023, 2:50 PM

## 2023-10-01 NOTE — Patient Instructions (Addendum)
 I have ordered an imaging study for you to complete prior to your next visit. Please call [facility] Scheduling at [phone] to schedule your imaging if you have not received a call within 24 hours. If you are unable to complete your imaging study prior to your next scheduled visit please call our office to let us  know. Do it in about 3 weeks   Use two sprays of flonase  in each nostril twice per day  right after, use astelin  spray two sprays each nostril twice per day  Use neti pot  Take Prednisone  by mouth (PO) 30mg  x 4 days (3 pills in morning), then 20mg  x4 days (2 pills), then 10mg  x 4 days (1 pill), then stop. Risks discussed

## 2023-10-09 DIAGNOSIS — M9901 Segmental and somatic dysfunction of cervical region: Secondary | ICD-10-CM | POA: Diagnosis not present

## 2023-10-09 DIAGNOSIS — M531 Cervicobrachial syndrome: Secondary | ICD-10-CM | POA: Diagnosis not present

## 2023-10-23 ENCOUNTER — Ambulatory Visit
Admission: RE | Admit: 2023-10-23 | Discharge: 2023-10-23 | Disposition: A | Discharge status: Home / Self Care | Source: Ambulatory Visit | Attending: Otolaryngology | Admitting: Otolaryngology

## 2023-10-23 DIAGNOSIS — J342 Deviated nasal septum: Secondary | ICD-10-CM

## 2023-10-23 DIAGNOSIS — J3489 Other specified disorders of nose and nasal sinuses: Secondary | ICD-10-CM

## 2023-10-23 DIAGNOSIS — J339 Nasal polyp, unspecified: Secondary | ICD-10-CM

## 2023-10-23 DIAGNOSIS — J343 Hypertrophy of nasal turbinates: Secondary | ICD-10-CM

## 2023-10-23 DIAGNOSIS — J0100 Acute maxillary sinusitis, unspecified: Secondary | ICD-10-CM | POA: Diagnosis not present

## 2023-10-23 DIAGNOSIS — R0981 Nasal congestion: Secondary | ICD-10-CM

## 2023-10-28 ENCOUNTER — Ambulatory Visit (HOSPITAL_COMMUNITY)

## 2023-10-29 DIAGNOSIS — M531 Cervicobrachial syndrome: Secondary | ICD-10-CM | POA: Diagnosis not present

## 2023-10-29 DIAGNOSIS — M9902 Segmental and somatic dysfunction of thoracic region: Secondary | ICD-10-CM | POA: Diagnosis not present

## 2023-10-29 DIAGNOSIS — M9901 Segmental and somatic dysfunction of cervical region: Secondary | ICD-10-CM | POA: Diagnosis not present

## 2023-10-29 DIAGNOSIS — M9908 Segmental and somatic dysfunction of rib cage: Secondary | ICD-10-CM | POA: Diagnosis not present

## 2023-11-07 ENCOUNTER — Ambulatory Visit (INDEPENDENT_AMBULATORY_CARE_PROVIDER_SITE_OTHER): Admitting: Otolaryngology

## 2023-11-07 ENCOUNTER — Encounter (INDEPENDENT_AMBULATORY_CARE_PROVIDER_SITE_OTHER): Payer: Self-pay | Admitting: Otolaryngology

## 2023-11-07 VITALS — BP 116/75 | HR 67 | Ht 61.0 in

## 2023-11-07 DIAGNOSIS — J342 Deviated nasal septum: Secondary | ICD-10-CM | POA: Diagnosis not present

## 2023-11-07 DIAGNOSIS — J328 Other chronic sinusitis: Secondary | ICD-10-CM | POA: Diagnosis not present

## 2023-11-07 DIAGNOSIS — J3489 Other specified disorders of nose and nasal sinuses: Secondary | ICD-10-CM | POA: Diagnosis not present

## 2023-11-07 DIAGNOSIS — J343 Hypertrophy of nasal turbinates: Secondary | ICD-10-CM | POA: Diagnosis not present

## 2023-11-07 DIAGNOSIS — R0981 Nasal congestion: Secondary | ICD-10-CM

## 2023-11-07 NOTE — Progress Notes (Signed)
 Dear Dr. Gretta, Here is my assessment for our mutual patient, Danielle Saunders. Thank you for allowing me the opportunity to care for your patient. Please do not hesitate to contact me should you have any other questions. Sincerely, Dr. Eldora Blanch  Otolaryngology Clinic Note  HISTORY: Danielle Saunders is a 43 y.o. female kindly referred by Dr. Gretta for evaluation of nasal congestion  Initial visit (09/2023): She reports that her main problem is left sided nasal congestion - feels like I can't even breathe out of left side - going on for over a year. Started around December, denies antecedent URI. No discolored drainage, no facial pressure, mostly just clear thick drainage, anterior rhinorrhea. No PND. Sense of smell off a little bit. She went to go see her PCP in Feb and was diagnosed with sinusitis and prescribed Augmentin . Did not help. She also got prednisone , helped temporarily but symptoms came right back. She has tried flonase , atrovent , and zyrtec as well. She tried all of this for over a month but did not help. Not using afrin currently but used in the remote past.  Does not typically have problems with sinus infections -- do not even get them once a year  No prior nasal trauma.   Symptom severity is severe.  Improvement occurred with prednisone  and sprays (very temporary). Very mild typical AR symptoms - sneezing, itching.  Allergy testing has not been done. No previous sinonasal surgery.  She is currently using no nasal medications.  --------------------------------------------------------- 11/07/2023 Continues to report significant congestion and facial pain/pressure. No discolored drainage from the nose. Sense of smell off. We discussed her CT and her options. She continues to report that abx/steroids only help temporarily.   GLP-1: no AP/AC: no  Tobacco: no  PMHx: Migraines  RADIOGRAPHIC EVALUATION AND INDEPENDENT REVIEW OF OTHER RECORDS:: Comer Gretta (02/20/2023):  noted cough, congestion, rhinorrhea, PND. Left nasal congestion, b/l max pressure. Tried dayquil, mucinex, afrin without improvement; Dx: Sinusitis; Rx: augmentin  Dr. Avelina (03/01/2023): improvement in sinusitis but persistent congestion; Rx: flonase , zyrtec 05/13/2023: taking prednisone  but now recurrent congestion; stopping flonase  and zyrtec, now on atrovent  07/07/2023: still congested on left, Dx: Nasal congestion; Rx: ref to ENT Amy lomax 02/25/2023): migraine meds, all PRN; migraines well managed reportedly Labs CBC and CMP 10/31/2020: WBC 6.9,  Eos 200; BUN/Cr 8/0.84 CTH 10/12/2015 independently interpreted: limited evaluation shows visualized frontal and sphenoid sinuses clear CT Sinus 10/23/2023 independently interpreted: right septal deviation with spur; b/l max, ethmoid and sphenoid opacification, appears chronic; dual dense left ethmoid(?) - fungal ball? B/l odontogenic(?); query small dehiscence, thinning -- ethmoid roof left -- cannot visualize obvious MT mass because of opacification Past Medical History:  Diagnosis Date   CERVICAL DYSPLASIA 03/14/2006   Qualifier: History of   By: Alla  MD, Alda      Replacing diagnoses that were inactivated after the 04/16/22 regulatory import     Chronic tension headaches    Chronic tension-type headache, not intractable 10/27/2014   DEPRESSIVE DISORDER, NOS 03/14/2006   Qualifier: Diagnosis of  By: Manford Longs     Genital herpes    PONV (postoperative nausea and vomiting)    Past Surgical History:  Procedure Laterality Date   ABDOMINOPLASTY     AUGMENTATION MAMMAPLASTY Bilateral 2017   BREAST ENHANCEMENT SURGERY     IUD REMOVAL N/A 02/11/2019   Procedure: INTRAUTERINE DEVICE (IUD) REMOVAL;  Surgeon: Rosalva Sawyer, MD;  Location: [facility];  Service: Gynecology;  Laterality: N/A;   LAPAROSCOPIC TUBAL  LIGATION Bilateral 02/11/2019   Procedure: LAPAROSCOPIC TUBAL LIGATION;  Surgeon: Rosalva Sawyer, MD;  Location: [facility];  Service: Gynecology;  Laterality: Bilateral;   Family History  Problem Relation Age of Onset   Hyperlipidemia Father    Migraines Paternal Aunt    Social History   Tobacco Use   Smoking status: Never   Smokeless tobacco: Never  Substance Use Topics   Alcohol use: No    Alcohol/week: 0.0 standard drinks of alcohol   No Known Allergies Current Outpatient Medications  Medication Sig Dispense Refill   azelastine  (ASTELIN ) 0.1 % nasal spray Place 2 sprays into both nostrils 2 (two) times daily. Use in each nostril as directed 30 mL 12   cholecalciferol (VITAMIN D3) 25 MCG (1000 UNIT) tablet Take 1,000 Units by mouth daily.     cyclobenzaprine  (FLEXERIL ) 10 MG tablet Take 1 tablet (10 mg total) by mouth 3 (three) times daily as needed for muscle spasms. 90 tablet 3   fluticasone  (FLONASE ) 50 MCG/ACT nasal spray Place 2 sprays into both nostrils in the morning and at bedtime. 16 g 6   Galcanezumab -gnlm (EMGALITY ) 120 MG/ML SOAJ Inject 120 mg into the skin every 30 (thirty) days. 3 mL 3   ibuprofen  (ADVIL ) 800 MG tablet Take 1 tablet (800 mg total) by mouth every 8 (eight) hours as needed. 30 tablet 0   Magnesium 100 MG CAPS Take by mouth.     Multiple Vitamin (MULTIVITAMIN) tablet Take 1 tablet by mouth daily.     Probiotic Product (PROBIOTIC-10 PO) Take by mouth.     promethazine  (PHENERGAN ) 12.5 MG tablet Take 1-2 tablets (12.5-25 mg total) by mouth every 8 (eight) hours as needed for nausea or vomiting. 30 tablet 11   valACYclovir (VALTREX) 500 MG tablet TAKE ONE TABLET BY MOUTH EVERY 24 HOURS.  9   Carbinoxamine  Maleate 4 MG TABS Take 1 tablet (4 mg total) by mouth 2 (two) times daily. For allergies. (Patient not taking: Reported on 11/07/2023) 60 tablet 0   ipratropium (ATROVENT ) 0.03 % nasal spray PLACE 2 SPRAYS INTO BOTH NOSTRILS EVERY 12 (TWELVE) HOURS. (Patient not taking: Reported on 11/07/2023) 30 mL 0   No current facility-administered medications for this  visit.   BP 116/75 (BP Location: Left Arm, Patient Position: Sitting, Cuff Size: Large)   Pulse 67   Ht 5' 1 (1.549 m)   SpO2 (!) 89%   BMI 33.07 kg/m   PHYSICAL EXAM:  BP 116/75 (BP Location: Left Arm, Patient Position: Sitting, Cuff Size: Large)   Pulse 67   Ht 5' 1 (1.549 m)   SpO2 (!) 89%   BMI 33.07 kg/m    Salient findings:  CN II-XII intact Bilateral EAC clear and TM intact with well pneumatized middle ear spaces Nose: Anterior rhinoscopy reveals septum mod dev, bilateral inferior turbinate hypertrophy.  Nasal endoscopy was indicated to better evaluate the nose and paranasal sinuses, given the patient's history and exam findings, and is detailed below. No lesions of oral cavity/oropharynx; dentition fair No respiratory distress or stridor   PROCEDURE:  Prior to initiating any procedures, risks/benefits/alternatives were explained to the patient and verbal consent obtained. Diagnostic Nasal Endoscopy Pre-procedure diagnosis: Concern for sinusitis, assess response Post-procedure diagnosis: same Indication: See pre-procedure diagnosis and physical exam above Complications: None apparent EBL: 0 mL Anesthesia: Lidocaine  4% and topical decongestant was topically sprayed in each nasal cavity  Description of Procedure:  Patient was identified. A rigid 30 degree endoscope was utilized to  evaluate the sinonasal cavities, mucosa, sinus ostia and turbinates and septum.  Overall, signs of mucosal inflammation are noted - modest mucosal edema diffusely left with retained mucoid secretions and polypoid middle turbinate.  No mucopurulence, polyps, or masses noted today  Right Middle meatus: fair amount of mucoid secretions Right SE Recess: clear Left MM: fair amount of mucoid secretions Left SE Recess: clear  Stable compared to last visit.     Photodocumentation was obtained.  CPT CODE -- 31231 - Mod 25   ASSESSMENT:  43 y.o. with:  1. Other chronic sinusitis   2. Nasal  congestion   3. Hypertrophy of both inferior nasal turbinates   4. Nasal obstruction   5. Nasal septal deviation    Noted significant left nasal congestion after sinusitis. Polypoid middle turbinate on left. Central compartment? But no h/o allergies. She has already tried medical therapy and sprays with persistent symptoms.  CT w/ b/l disease (odontogenic? - but she reports dentist did not report a concern) - left fungal ball ethmoid(?) v/s mass(? -- seems less likely). As such, we discussed options: ab/steroids v/s FESS  Given chronic issues, she would opt for FESS but will check with insurance re: cost We discussed the goals of septoplasty and turbinate reduction, and expectations for postoperative management. Will plan to leave splints in place, and removal was also discussed. We also discussed nasal obstruction post-operatively until splints in place and pain management. We discussed the goals of sinus surgery, and expectations for postoperative management. We discussed R/B/A including pain, infection, bleeding (~2% risk of operative visit for control), persistent symptoms, need for revision surgery, and other risks including damage to the eye, and injury to skull base , anesthetic complications, among others.  Patient understands this   We discussed use of nasal saline spray and nasal saline irrigations post-operatively We also discussed use of intranasal steroid post-operatively until healing occurs  PLAN: We've discussed issues and options today.  We reviewed the nasal endoscopy images together.  The risks, benefits and alternatives were discussed and questions answered.  She has elected to proceed with:  1) Continue Flonase  and astelin  BID 2) Daily sinus rinses 3) Will schedule for septo/turbs/FESS pending insurance cost --- she'll check. F/u 1 week pre-op  See below regarding exact medications prescribed this encounter including dosages and route: No orders of the defined types were  placed in this encounter.    Thank you for allowing me the opportunity to care for your patient. Please do not hesitate to contact me should you have any other questions.  Sincerely, Eldora Blanch, MD Otolaryngologist (ENT), [facility] Phone: [phone] Fax: [phone]  MDM:  Level 4: [phone] Complexity/Problems addressed: mod - chronic problems, worsening Data complexity: mod - independent interpretation of imaging - Morbidity: mod - decision for surgery - Prescription Drug prescribed or managed: n  11/07/2023, 10:24 AM

## 2023-11-07 NOTE — Patient Instructions (Addendum)
 Surgery codes: --- CPT 30520, CPT 30140-50, CPT [id number], CPT B2291306 -- everything on both sides.

## 2023-11-26 DIAGNOSIS — M9901 Segmental and somatic dysfunction of cervical region: Secondary | ICD-10-CM | POA: Diagnosis not present

## 2023-11-26 DIAGNOSIS — M9902 Segmental and somatic dysfunction of thoracic region: Secondary | ICD-10-CM | POA: Diagnosis not present

## 2023-11-26 DIAGNOSIS — M9908 Segmental and somatic dysfunction of rib cage: Secondary | ICD-10-CM | POA: Diagnosis not present

## 2023-11-26 DIAGNOSIS — M531 Cervicobrachial syndrome: Secondary | ICD-10-CM | POA: Diagnosis not present

## 2023-12-10 ENCOUNTER — Other Ambulatory Visit: Payer: Self-pay | Admitting: Primary Care

## 2023-12-10 DIAGNOSIS — A6000 Herpesviral infection of urogenital system, unspecified: Secondary | ICD-10-CM

## 2024-01-01 ENCOUNTER — Ambulatory Visit: Admitting: Nurse Practitioner

## 2024-01-01 ENCOUNTER — Ambulatory Visit: Payer: Self-pay

## 2024-01-01 VITALS — BP 110/86 | HR 85 | Temp 97.6°F | Ht 61.0 in | Wt 176.2 lb

## 2024-01-01 DIAGNOSIS — J029 Acute pharyngitis, unspecified: Secondary | ICD-10-CM

## 2024-01-01 DIAGNOSIS — J101 Influenza due to other identified influenza virus with other respiratory manifestations: Secondary | ICD-10-CM

## 2024-01-01 LAB — POCT INFLUENZA A/B
Influenza A, POC: POSITIVE — AB
Influenza B, POC: NEGATIVE

## 2024-01-01 MED ORDER — OSELTAMIVIR PHOSPHATE 75 MG PO CAPS: 75.0000 mg | ORAL_CAPSULE | Freq: Two times a day (BID) | ORAL | 0 refills | Status: AC

## 2024-01-01 NOTE — Patient Instructions (Signed)
 It was great to see you!  Start tamiflu  twice a day for 5 days  Drink plenty of fluids and get rest  You can take dayquil/nyquil as needed  Let's follow-up if your symptoms worsen or don't improve   Take care,  Tinnie Harada, NP

## 2024-01-01 NOTE — Progress Notes (Signed)
 Acute Office Visit  Subjective:     Patient ID: Danielle Saunders, female    DOB: 1980/09/19, 43 y.o.   MRN: [id number]  Chief Complaint  Patient presents with   Cough    With sore throat, runny nose, headache   HPI:  Discussed the use of AI scribe software for clinical note transcription with the patient, who gave verbal consent to proceed.  History of Present Illness   Danielle Saunders is a 43 year old female who presents with sore throat, cough, and headache.  Symptoms started yesterday with sore throat, mild non-productive cough, headache, and nasal congestion. She denies fever, stomach ache, nausea, or significant shortness of breath but has trouble breathing through her nose. DayQuil today did not help.  She has chronic sinus congestion from a deviated septum and a left nasal polyp. Her left nostril is now markedly blocked and constantly running, worse than usual. She notes mild ear pain and mild upper neck discomfort. She was exposed to several people with the flu last weekend.      ROS See pertinent positives and negatives per HPI.     Objective:    BP 110/86 (BP Location: Left Arm, Patient Position: Sitting, Cuff Size: Normal)   Pulse 85   Temp 97.6 F (36.4 C) (Oral)   Ht 5' 1 (1.549 m)   Wt 176 lb 3.2 oz (79.9 kg)   LMP 12/30/2023 (Exact Date)   SpO2 96%   BMI 33.29 kg/m     Physical Exam Vitals and nursing note reviewed.  Constitutional:      General: She is not in acute distress.    Appearance: Normal appearance.  HENT:     Head: Normocephalic.     Right Ear: Tympanic membrane, ear canal and external ear normal.     Left Ear: Tympanic membrane, ear canal and external ear normal.     Nose: Congestion and rhinorrhea present.     Mouth/Throat:     Mouth: Mucous membranes are moist.     Pharynx: Posterior oropharyngeal erythema present. No oropharyngeal exudate.  Eyes:     Conjunctiva/sclera: Conjunctivae normal.  Cardiovascular:     Rate and Rhythm:  Normal rate and regular rhythm.     Pulses: Normal pulses.     Heart sounds: Normal heart sounds.  Pulmonary:     Effort: Pulmonary effort is normal.     Breath sounds: Normal breath sounds.  Musculoskeletal:     Cervical back: Normal range of motion and neck supple. No tenderness.  Lymphadenopathy:     Cervical: No cervical adenopathy.  Skin:    General: Skin is warm.  Neurological:     General: No focal deficit present.     Mental Status: She is alert and oriented to person, place, and time.  Psychiatric:        Mood and Affect: Mood normal.        Behavior: Behavior normal.        Thought Content: Thought content normal.        Judgment: Judgment normal.     Results for orders placed or performed in visit on 01/01/24  POCT Influenza A/B  Result Value Ref Range   Influenza A, POC Positive (A) Negative   Influenza B, POC Negative Negative        Assessment & Plan:   Problem List Items Addressed This Visit   None Visit Diagnoses       Influenza A    -  Primary   Start tamiflu  75mg  BID. Enocurage fluids, rest. Can continue OTC dayquil/nyquil prn. Follow-up if not improving.   Relevant Medications   oseltamivir  (TAMIFLU ) 75 MG capsule     Sore throat       POC flu A positive. Can take tylenol  or ibuprofen  as needed for sore throat. See plan above for flu.   Relevant Orders   POCT Influenza A/B (Completed)       Meds ordered this encounter  Medications   oseltamivir  (TAMIFLU ) 75 MG capsule    Sig: Take 1 capsule (75 mg total) by mouth 2 (two) times daily for 5 days.    Dispense:  10 capsule    Refill:  0    Return if symptoms worsen or fail to improve.  Tinnie DELENA Harada, NP

## 2024-01-01 NOTE — Telephone Encounter (Signed)
 FYI Only or Action Required?: FYI only for provider: appointment scheduled on 01/01/24.  Patient was last seen in primary care on 04/30/2023 by Gretta Comer POUR, NP.  Called Nurse Triage reporting Sore Throat and Nasal Congestion.  Symptoms began yesterday.  Interventions attempted: Rest, hydration, or home remedies.  Symptoms are: gradually worsening.  Triage Disposition: See Physician Within 24 Hours  Patient/caregiver understands and will follow disposition?: Yes  Reason for Disposition  Earache also present  Answer Assessment - Initial Assessment Questions 1. ONSET: When did the throat start hurting? (Hours or days ago)      Yesterday  2. SEVERITY: How bad is the sore throat? (Scale 1-10; mild, moderate or severe)     5/6  3. STREP EXPOSURE: Has there been any exposure to strep within the past week? If Yes, ask: What type of contact occurred?      No, patient states she was at a pageant over the weekend where she learned some people had the flu. Some people that were with her tested positive yesterday for flu.   4.  VIRAL SYMPTOMS: Are there any symptoms of a cold, such as a runny nose, cough, hoarse voice or red eyes?      Runny nose, cough  5. FEVER: Do you have a fever? If Yes, ask: What is your temperature, how was it measured, and when did it start?     No-unsure  6. PUS ON THE TONSILS: Is there pus on the tonsils in the back of your throat?     No  7. OTHER SYMPTOMS: Do you have any other symptoms? (e.g., difficulty breathing, headache, rash)     Headaches, ear ache left side  8. PREGNANCY: Is there any chance you are pregnant? When was your last menstrual period?     No  Protocols used: Sore Throat-A-AH  Copied from CRM #[id number]. Topic: Clinical - Red Word Triage >> Jan 01, 2024  9:45 AM Treva T wrote: [facility] that prompted transfer to Nurse Triage: Pt calling reports over the weekend she was at a pageant and exposed to persons who  she believes had the flu.  She reports symptoms of throat pain, sore throat, coughing, and increased congestion.  Pt requesting to be seen for an appt for evaluation.   Pt ph. [phone]

## 2024-01-01 NOTE — Telephone Encounter (Signed)
 Noted. Agree with nursing triage decision. Appreciate provider's evaluation.

## 2024-02-12 ENCOUNTER — Telehealth (INDEPENDENT_AMBULATORY_CARE_PROVIDER_SITE_OTHER): Payer: Self-pay

## 2024-02-12 NOTE — Telephone Encounter (Signed)
 Patient called call was answered. Patient called and stated that Dr. Tobie stated if she was getting bad before her surgery that he would call her in an antibiotic to help her. Patient stated that the left side is consistently running and draining but she can not blow anything out of it and the nose sprays that Dr. Tobie sent in are not working right now. Please advise.   [id number] - [place], [place] - [address]

## 2024-02-18 ENCOUNTER — Encounter (INDEPENDENT_AMBULATORY_CARE_PROVIDER_SITE_OTHER): Payer: Self-pay

## 2024-02-18 MED ORDER — AMOXICILLIN-POT CLAVULANATE 875-125 MG PO TABS: 1.0000 | ORAL_TABLET | Freq: Two times a day (BID) | ORAL | 0 refills | Status: AC

## 2024-02-18 NOTE — Telephone Encounter (Signed)
 Called patient and let them know that Dr. Tobie sent in the antibiotic and she is to take with food. Patient understood.

## 2024-02-18 NOTE — Addendum Note (Signed)
 Addended by: Betsabe Iglesia on: 02/18/2024 10:34 AM   Modules accepted: Orders

## 2024-03-03 ENCOUNTER — Telehealth: Payer: BC Managed Care – PPO | Admitting: Family Medicine

## 2024-03-19 ENCOUNTER — Ambulatory Visit (INDEPENDENT_AMBULATORY_CARE_PROVIDER_SITE_OTHER): Admitting: Otolaryngology

## 2024-04-01 ENCOUNTER — Ambulatory Visit (INDEPENDENT_AMBULATORY_CARE_PROVIDER_SITE_OTHER): Admitting: Otolaryngology

## 2024-05-14 ENCOUNTER — Ambulatory Visit (INDEPENDENT_AMBULATORY_CARE_PROVIDER_SITE_OTHER): Admitting: Otolaryngology

## 2024-05-27 ENCOUNTER — Ambulatory Visit (INDEPENDENT_AMBULATORY_CARE_PROVIDER_SITE_OTHER): Admitting: Otolaryngology
